# Patient Record
Sex: Male | Born: 1970
Health system: Southern US, Community
[De-identification: ages and names within clinical notes are randomized; demographics above are authoritative.]

## PROBLEM LIST (undated history)

## (undated) DIAGNOSIS — L4052 Psoriatic arthritis mutilans: Secondary | ICD-10-CM

## (undated) DIAGNOSIS — B20 Human immunodeficiency virus [HIV] disease: Secondary | ICD-10-CM

## (undated) DIAGNOSIS — Z21 Asymptomatic human immunodeficiency virus [HIV] infection status: Secondary | ICD-10-CM

## (undated) HISTORY — DX: Psoriatic arthritis mutilans: L40.52

## (undated) HISTORY — DX: Human immunodeficiency virus (HIV) disease: B20

## (undated) HISTORY — DX: Asymptomatic human immunodeficiency virus (hiv) infection status: Z21

---

## 2003-10-06 ENCOUNTER — Emergency Department (HOSPITAL_COMMUNITY): Admission: EM | Admit: 2003-10-06 | Discharge: 2003-10-06 | Payer: Self-pay | Admitting: Emergency Medicine

## 2003-10-06 ENCOUNTER — Encounter (INDEPENDENT_AMBULATORY_CARE_PROVIDER_SITE_OTHER): Payer: Self-pay | Admitting: Emergency Medicine

## 2003-10-21 ENCOUNTER — Inpatient Hospital Stay (HOSPITAL_COMMUNITY): Admission: AD | Admit: 2003-10-21 | Discharge: 2003-10-26 | Payer: Self-pay | Admitting: Infectious Diseases

## 2003-10-21 ENCOUNTER — Encounter: Admission: RE | Admit: 2003-10-21 | Discharge: 2003-10-21 | Payer: Self-pay | Admitting: Infectious Diseases

## 2003-11-02 ENCOUNTER — Inpatient Hospital Stay (HOSPITAL_COMMUNITY): Admission: AD | Admit: 2003-11-02 | Discharge: 2003-11-05 | Payer: Self-pay | Admitting: Internal Medicine

## 2003-11-04 ENCOUNTER — Encounter (INDEPENDENT_AMBULATORY_CARE_PROVIDER_SITE_OTHER): Payer: Self-pay | Admitting: Specialist

## 2003-11-04 ENCOUNTER — Encounter: Payer: Self-pay | Admitting: Infectious Diseases

## 2003-11-10 ENCOUNTER — Inpatient Hospital Stay (HOSPITAL_COMMUNITY): Admission: AD | Admit: 2003-11-10 | Discharge: 2003-11-18 | Payer: Self-pay | Admitting: Internal Medicine

## 2003-12-21 ENCOUNTER — Encounter (INDEPENDENT_AMBULATORY_CARE_PROVIDER_SITE_OTHER): Payer: Self-pay | Admitting: *Deleted

## 2003-12-21 LAB — CONVERTED CEMR LAB
CD4 Count: 49 microliters
CD4 T Cell Abs: 49

## 2004-02-04 ENCOUNTER — Encounter: Admission: RE | Admit: 2004-02-04 | Discharge: 2004-02-04 | Payer: Self-pay | Admitting: Infectious Diseases

## 2004-03-01 ENCOUNTER — Encounter: Admission: RE | Admit: 2004-03-01 | Discharge: 2004-03-01 | Payer: Self-pay | Admitting: Infectious Diseases

## 2004-03-14 ENCOUNTER — Encounter: Admission: RE | Admit: 2004-03-14 | Discharge: 2004-03-14 | Payer: Self-pay | Admitting: Infectious Diseases

## 2004-04-07 ENCOUNTER — Encounter: Admission: RE | Admit: 2004-04-07 | Discharge: 2004-04-07 | Payer: Self-pay | Admitting: Infectious Diseases

## 2004-06-15 ENCOUNTER — Encounter: Admission: RE | Admit: 2004-06-15 | Discharge: 2004-06-15 | Payer: Self-pay | Admitting: Infectious Diseases

## 2004-07-27 ENCOUNTER — Ambulatory Visit (HOSPITAL_COMMUNITY): Admission: RE | Admit: 2004-07-27 | Discharge: 2004-07-27 | Payer: Self-pay | Admitting: Infectious Diseases

## 2004-07-27 ENCOUNTER — Ambulatory Visit: Payer: Self-pay | Admitting: Infectious Diseases

## 2004-08-24 ENCOUNTER — Ambulatory Visit: Payer: Self-pay | Admitting: Infectious Diseases

## 2004-10-09 ENCOUNTER — Ambulatory Visit: Payer: Self-pay | Admitting: Infectious Diseases

## 2004-10-09 ENCOUNTER — Ambulatory Visit (HOSPITAL_COMMUNITY): Admission: RE | Admit: 2004-10-09 | Discharge: 2004-10-09 | Payer: Self-pay | Admitting: Infectious Diseases

## 2004-10-26 ENCOUNTER — Ambulatory Visit: Payer: Self-pay | Admitting: Infectious Diseases

## 2005-01-12 ENCOUNTER — Ambulatory Visit (HOSPITAL_COMMUNITY): Admission: RE | Admit: 2005-01-12 | Discharge: 2005-01-12 | Payer: Self-pay | Admitting: Infectious Diseases

## 2005-01-12 ENCOUNTER — Ambulatory Visit: Payer: Self-pay | Admitting: Infectious Diseases

## 2005-01-25 ENCOUNTER — Ambulatory Visit: Payer: Self-pay | Admitting: Infectious Diseases

## 2005-05-09 ENCOUNTER — Ambulatory Visit (HOSPITAL_COMMUNITY): Admission: RE | Admit: 2005-05-09 | Discharge: 2005-05-09 | Payer: Self-pay | Admitting: Infectious Diseases

## 2005-05-09 ENCOUNTER — Ambulatory Visit: Payer: Self-pay | Admitting: Infectious Diseases

## 2005-05-24 ENCOUNTER — Ambulatory Visit: Payer: Self-pay | Admitting: Infectious Diseases

## 2005-07-20 ENCOUNTER — Encounter (INDEPENDENT_AMBULATORY_CARE_PROVIDER_SITE_OTHER): Payer: Self-pay | Admitting: *Deleted

## 2005-07-20 ENCOUNTER — Ambulatory Visit: Payer: Self-pay | Admitting: Infectious Diseases

## 2005-07-20 ENCOUNTER — Ambulatory Visit (HOSPITAL_COMMUNITY): Admission: RE | Admit: 2005-07-20 | Discharge: 2005-07-20 | Payer: Self-pay | Admitting: Infectious Diseases

## 2005-07-20 LAB — CONVERTED CEMR LAB
CD4 Count: 480 uL
HIV 1 RNA Quant: 399 {copies}/mL

## 2006-02-28 ENCOUNTER — Encounter: Admission: RE | Admit: 2006-02-28 | Discharge: 2006-02-28 | Payer: Self-pay | Admitting: Infectious Diseases

## 2006-02-28 ENCOUNTER — Ambulatory Visit: Payer: Self-pay | Admitting: Infectious Diseases

## 2006-02-28 ENCOUNTER — Encounter (INDEPENDENT_AMBULATORY_CARE_PROVIDER_SITE_OTHER): Payer: Self-pay | Admitting: *Deleted

## 2006-02-28 LAB — CONVERTED CEMR LAB
CD4 Count: 390 microliters
HIV 1 RNA Quant: 399 copies/mL

## 2006-03-14 ENCOUNTER — Ambulatory Visit: Payer: Self-pay | Admitting: Infectious Diseases

## 2006-05-16 ENCOUNTER — Encounter: Admission: RE | Admit: 2006-05-16 | Discharge: 2006-05-16 | Payer: Self-pay | Admitting: Infectious Diseases

## 2006-05-16 ENCOUNTER — Ambulatory Visit: Payer: Self-pay | Admitting: Infectious Diseases

## 2006-05-16 ENCOUNTER — Encounter (INDEPENDENT_AMBULATORY_CARE_PROVIDER_SITE_OTHER): Payer: Self-pay | Admitting: *Deleted

## 2006-05-16 LAB — CONVERTED CEMR LAB
CD4 Count: 280 microliters
HIV 1 RNA Quant: 3000 copies/mL

## 2006-12-30 ENCOUNTER — Encounter (INDEPENDENT_AMBULATORY_CARE_PROVIDER_SITE_OTHER): Payer: Self-pay | Admitting: *Deleted

## 2006-12-30 LAB — CONVERTED CEMR LAB

## 2007-01-12 ENCOUNTER — Encounter (INDEPENDENT_AMBULATORY_CARE_PROVIDER_SITE_OTHER): Payer: Self-pay | Admitting: *Deleted

## 2007-10-15 ENCOUNTER — Telehealth: Payer: Self-pay

## 2008-02-11 ENCOUNTER — Inpatient Hospital Stay (HOSPITAL_COMMUNITY): Admission: EM | Admit: 2008-02-11 | Discharge: 2008-02-17 | Payer: Self-pay | Admitting: Emergency Medicine

## 2008-02-11 ENCOUNTER — Ambulatory Visit: Payer: Self-pay | Admitting: Infectious Diseases

## 2008-02-12 ENCOUNTER — Encounter (INDEPENDENT_AMBULATORY_CARE_PROVIDER_SITE_OTHER): Payer: Self-pay | Admitting: Internal Medicine

## 2008-02-16 ENCOUNTER — Telehealth: Payer: Self-pay | Admitting: Infectious Diseases

## 2008-02-17 ENCOUNTER — Encounter (INDEPENDENT_AMBULATORY_CARE_PROVIDER_SITE_OTHER): Payer: Self-pay | Admitting: Internal Medicine

## 2008-02-24 ENCOUNTER — Telehealth: Payer: Self-pay | Admitting: Infectious Diseases

## 2008-03-17 ENCOUNTER — Encounter: Payer: Self-pay | Admitting: Infectious Diseases

## 2008-03-18 ENCOUNTER — Ambulatory Visit: Payer: Self-pay | Admitting: Infectious Diseases

## 2008-03-18 ENCOUNTER — Encounter: Admission: RE | Admit: 2008-03-18 | Discharge: 2008-03-18 | Payer: Self-pay | Admitting: Infectious Diseases

## 2008-03-18 DIAGNOSIS — B20 Human immunodeficiency virus [HIV] disease: Secondary | ICD-10-CM | POA: Insufficient documentation

## 2008-03-18 LAB — CONVERTED CEMR LAB
ALT: 8 units/L (ref 0–53)
AST: 12 units/L (ref 0–37)
Albumin: 4.6 g/dL (ref 3.5–5.2)
Alkaline Phosphatase: 78 units/L (ref 39–117)
BUN: 19 mg/dL (ref 6–23)
Basophils Absolute: 0 10*3/uL (ref 0.0–0.1)
Basophils Relative: 0 % (ref 0–1)
CO2: 23 meq/L (ref 19–32)
Calcium: 9.6 mg/dL (ref 8.4–10.5)
Chloride: 103 meq/L (ref 96–112)
Creatinine, Ser: 0.98 mg/dL (ref 0.40–1.50)
Eosinophils Absolute: 0.1 10*3/uL (ref 0.0–0.7)
Eosinophils Relative: 1 % (ref 0–5)
Glucose, Bld: 91 mg/dL (ref 70–99)
HCT: 39.3 % (ref 39.0–52.0)
HIV 1 RNA Quant: 110 copies/mL — ABNORMAL HIGH (ref <50)
HIV-1 RNA Quant, Log: 2.04 — ABNORMAL HIGH (ref <1.70)
Hemoglobin: 13.2 g/dL (ref 13.0–17.0)
Lymphocytes Relative: 14 % (ref 12–46)
Lymphs Abs: 1.4 10*3/uL (ref 0.7–4.0)
MCHC: 33.6 g/dL (ref 30.0–36.0)
MCV: 90.8 fL (ref 78.0–100.0)
Monocytes Absolute: 1.2 10*3/uL — ABNORMAL HIGH (ref 0.1–1.0)
Monocytes Relative: 13 % — ABNORMAL HIGH (ref 3–12)
Neutro Abs: 6.9 10*3/uL (ref 1.7–7.7)
Neutrophils Relative %: 72 % (ref 43–77)
Platelets: 413 10*3/uL — ABNORMAL HIGH (ref 150–400)
Potassium: 4.2 meq/L (ref 3.5–5.3)
RBC: 4.33 M/uL (ref 4.22–5.81)
RDW: 13.2 % (ref 11.5–15.5)
Sodium: 141 meq/L (ref 135–145)
Total Bilirubin: 0.5 mg/dL (ref 0.3–1.2)
Total Protein: 8.4 g/dL — ABNORMAL HIGH (ref 6.0–8.3)
WBC: 9.6 10*3/uL (ref 4.0–10.5)

## 2008-04-05 ENCOUNTER — Encounter: Payer: Self-pay | Admitting: Infectious Diseases

## 2008-04-12 ENCOUNTER — Ambulatory Visit: Payer: Self-pay | Admitting: Infectious Diseases

## 2008-06-01 ENCOUNTER — Encounter: Admission: RE | Admit: 2008-06-01 | Discharge: 2008-06-01 | Payer: Self-pay | Admitting: Infectious Diseases

## 2008-06-01 ENCOUNTER — Ambulatory Visit: Payer: Self-pay | Admitting: Infectious Diseases

## 2008-06-01 LAB — CONVERTED CEMR LAB
ALT: 16 units/L (ref 0–53)
AST: 14 units/L (ref 0–37)
Albumin: 4.7 g/dL (ref 3.5–5.2)
Alkaline Phosphatase: 83 units/L (ref 39–117)
BUN: 21 mg/dL (ref 6–23)
Basophils Absolute: 0 10*3/uL (ref 0.0–0.1)
Basophils Relative: 0 % (ref 0–1)
CO2: 22 meq/L (ref 19–32)
Calcium: 9.9 mg/dL (ref 8.4–10.5)
Chloride: 102 meq/L (ref 96–112)
Creatinine, Ser: 1.1 mg/dL (ref 0.40–1.50)
Eosinophils Absolute: 0.1 10*3/uL (ref 0.0–0.7)
Eosinophils Relative: 1 % (ref 0–5)
Glucose, Bld: 100 mg/dL — ABNORMAL HIGH (ref 70–99)
HCT: 47 % (ref 39.0–52.0)
HIV 1 RNA Quant: 58 copies/mL — ABNORMAL HIGH (ref <50)
HIV-1 RNA Quant, Log: 1.76 — ABNORMAL HIGH (ref <1.70)
Hemoglobin: 16 g/dL (ref 13.0–17.0)
Lymphocytes Relative: 14 % (ref 12–46)
Lymphs Abs: 1.1 10*3/uL (ref 0.7–4.0)
MCHC: 34 g/dL (ref 30.0–36.0)
MCV: 88 fL (ref 78.0–100.0)
Monocytes Absolute: 1 10*3/uL (ref 0.1–1.0)
Monocytes Relative: 13 % — ABNORMAL HIGH (ref 3–12)
Neutro Abs: 5.5 10*3/uL (ref 1.7–7.7)
Neutrophils Relative %: 72 % (ref 43–77)
Platelets: 317 10*3/uL (ref 150–400)
Potassium: 4.4 meq/L (ref 3.5–5.3)
RBC: 5.34 M/uL (ref 4.22–5.81)
RDW: 15.5 % (ref 11.5–15.5)
Sodium: 138 meq/L (ref 135–145)
Total Bilirubin: 0.4 mg/dL (ref 0.3–1.2)
Total Protein: 8.1 g/dL (ref 6.0–8.3)
WBC: 7.7 10*3/uL (ref 4.0–10.5)

## 2008-06-11 ENCOUNTER — Ambulatory Visit: Payer: Self-pay | Admitting: Infectious Diseases

## 2008-06-11 ENCOUNTER — Telehealth: Payer: Self-pay

## 2008-06-15 ENCOUNTER — Telehealth: Payer: Self-pay

## 2008-06-16 ENCOUNTER — Encounter: Payer: Self-pay | Admitting: Infectious Diseases

## 2008-06-16 ENCOUNTER — Telehealth: Payer: Self-pay | Admitting: Infectious Diseases

## 2008-09-06 ENCOUNTER — Encounter: Payer: Self-pay | Admitting: Infectious Diseases

## 2010-09-25 ENCOUNTER — Encounter (INDEPENDENT_AMBULATORY_CARE_PROVIDER_SITE_OTHER): Payer: Self-pay | Admitting: *Deleted

## 2010-12-07 NOTE — Miscellaneous (Signed)
  Clinical Lists Changes  Observations: Added new observation of YEARAIDSPOS: 2006  (09/25/2010 11:07) Added new observation of HIV STATUS: CDC-defined AIDS  (09/25/2010 11:07)

## 2010-12-21 ENCOUNTER — Other Ambulatory Visit: Payer: Self-pay | Admitting: Infectious Diseases

## 2010-12-21 ENCOUNTER — Encounter: Payer: Self-pay | Admitting: Infectious Diseases

## 2010-12-21 ENCOUNTER — Ambulatory Visit (INDEPENDENT_AMBULATORY_CARE_PROVIDER_SITE_OTHER): Payer: Self-pay | Admitting: Infectious Diseases

## 2010-12-21 DIAGNOSIS — B20 Human immunodeficiency virus [HIV] disease: Secondary | ICD-10-CM

## 2010-12-21 LAB — CONVERTED CEMR LAB
ALT: 22 units/L (ref 0–53)
AST: 14 units/L (ref 0–37)
BUN: 21 mg/dL (ref 6–23)
Calcium: 9.8 mg/dL (ref 8.4–10.5)
Chloride: 101 meq/L (ref 96–112)
Creatinine, Ser: 1 mg/dL (ref 0.40–1.50)
Eosinophils Absolute: 0 10*3/uL (ref 0.0–0.7)
Eosinophils Relative: 0 % (ref 0–5)
HCT: 43.1 % (ref 39.0–52.0)
HIV 1 RNA Quant: 3040 copies/mL — ABNORMAL HIGH (ref <20)
Hemoglobin: 14.6 g/dL (ref 13.0–17.0)
Lymphocytes Relative: 14 % (ref 12–46)
Lymphs Abs: 2.4 10*3/uL (ref 0.7–4.0)
MCV: 91.1 fL (ref 78.0–100.0)
Monocytes Absolute: 2.2 10*3/uL — ABNORMAL HIGH (ref 0.1–1.0)
Platelets: 403 10*3/uL — ABNORMAL HIGH (ref 150–400)
Total Bilirubin: 0.3 mg/dL (ref 0.3–1.2)
WBC: 16.7 10*3/uL — ABNORMAL HIGH (ref 4.0–10.5)

## 2010-12-22 LAB — T-HELPER CELL (CD4) - (RCID CLINIC ONLY)
CD4 % Helper T Cell: 28 % — ABNORMAL LOW (ref 33–55)
CD4 T Cell Abs: 750 uL (ref 400–2700)

## 2011-01-02 NOTE — Miscellaneous (Signed)
Summary: Medication Contract  Medication Contract   Imported By: Florinda Marker 12/26/2010 15:06:38  _____________________________________________________________________  External Attachment:    Type:   Image     Comment:   External Document

## 2011-01-12 LAB — HIV-1 GENOTYPR PLUS

## 2011-01-18 ENCOUNTER — Telehealth: Payer: Self-pay | Admitting: *Deleted

## 2011-01-23 NOTE — Progress Notes (Signed)
  Phone Note Call from Patient   Caller: Patient Summary of Call: A PHARMACIST [facility] called [phone] asking if rx for Oxycontin 40mg  #60 was correct. unable to verify in chart as notes were not yet completed. I gave her the md pager number. she will speak with him   Initial call taken by: Golden Circle RN,  January 18, 2011 4:47 PM

## 2011-01-25 ENCOUNTER — Ambulatory Visit: Payer: Self-pay | Admitting: Infectious Diseases

## 2011-01-25 ENCOUNTER — Other Ambulatory Visit: Payer: Self-pay | Admitting: Licensed Clinical Social Worker

## 2011-01-25 ENCOUNTER — Ambulatory Visit (INDEPENDENT_AMBULATORY_CARE_PROVIDER_SITE_OTHER): Payer: Medicaid Other | Admitting: Infectious Diseases

## 2011-01-25 VITALS — BP 117/74 | HR 90 | Temp 98.4°F | Wt 145.4 lb

## 2011-01-25 DIAGNOSIS — B20 Human immunodeficiency virus [HIV] disease: Secondary | ICD-10-CM

## 2011-01-25 DIAGNOSIS — G8929 Other chronic pain: Secondary | ICD-10-CM

## 2011-01-25 DIAGNOSIS — R319 Hematuria, unspecified: Secondary | ICD-10-CM | POA: Insufficient documentation

## 2011-01-25 LAB — URINALYSIS, ROUTINE W REFLEX MICROSCOPIC
Leukocytes, UA: NEGATIVE
Nitrite: NEGATIVE
Specific Gravity, Urine: 1.024 (ref 1.005–1.030)
Urobilinogen, UA: 0.2 mg/dL (ref 0.0–1.0)
pH: 5.5 (ref 5.0–8.0)

## 2011-01-25 MED ORDER — RALTEGRAVIR POTASSIUM 400 MG PO TABS: 400.0000 mg | ORAL_TABLET | Freq: Two times a day (BID) | ORAL | Status: DC

## 2011-01-25 MED ORDER — OXYCODONE-ACETAMINOPHEN 5-325 MG PO TABS: 2.0000 | ORAL_TABLET | Freq: Two times a day (BID) | ORAL | Status: AC

## 2011-01-25 MED ORDER — FLUOCINONIDE 0.05 % EX OINT: 1.0000 "application " | TOPICAL_OINTMENT | Freq: Every day | CUTANEOUS | Status: DC

## 2011-01-25 MED ORDER — RITONAVIR 100 MG PO CAPS: 100.0000 mg | ORAL_CAPSULE | Freq: Two times a day (BID) | ORAL | Status: DC

## 2011-01-25 MED ORDER — DARUNAVIR ETHANOLATE 600 MG PO TABS: 600.0000 mg | ORAL_TABLET | Freq: Two times a day (BID) | ORAL | Status: DC

## 2011-01-25 MED ORDER — EMTRICITABINE-TENOFOVIR DF 200-300 MG PO TABS: 1.0000 | ORAL_TABLET | Freq: Every day | ORAL | Status: DC

## 2011-01-25 NOTE — Progress Notes (Signed)
  Subjective:    Patient ID: Britt Bottom, male    DOB: 1971/09/06, 40 y.o.   MRN: [id number]  HPIKeith has been ARVs for past two months but need new scripts. His HIV is 3040 which much improved from 6 weeks ago whe]n he had >40,000 VL. His CD4 count is 720 and he has had improvement in his psoriasis. He needs his opiates and will switch to Percocet 2 BID.      Review of Systems  Constitutional: Negative.   HENT: Negative.   Eyes: Negative.   Respiratory: Negative.   Cardiovascular: Negative.   Genitourinary: Positive for hematuria.  Musculoskeletal: Positive for back pain and arthralgias.       Objective:   Physical Exam  Constitutional: He appears well-developed and well-nourished.  HENT:  Mouth/Throat: Oropharynx is clear and moist.  Neck: Neck supple. No thyromegaly present.  Cardiovascular: Normal rate, regular rhythm and normal heart sounds.   Pulmonary/Chest: Breath sounds normal.  Abdominal: Soft. Bowel sounds are normal.  Genitourinary: Penis normal.  Lymphadenopathy:    He has no cervical adenopathy.  Skin: Skin is dry. Rash noted.       Psoriatic rash on umbilicus chronic and chronic nail bed changes of psroiasis.          Assessment & Plan:

## 2011-01-25 NOTE — Patient Instructions (Signed)
Fill new scripts and will see in f/u in 3 months.

## 2011-01-25 NOTE — Assessment & Plan Note (Signed)
Prior history x2 over past 3 years.No old records available. Noted mild dysuria and hematuria that clears during the day over past week.    Will get U/A and routine urine culture today.

## 2011-01-25 NOTE — Progress Notes (Signed)
Addended by: Starleen Arms on: 01/25/2011 11:43 AM   Modules accepted: Orders

## 2011-01-27 LAB — URINE CULTURE: Colony Count: 15000

## 2011-02-22 ENCOUNTER — Encounter: Payer: Self-pay | Admitting: Infectious Diseases

## 2011-02-22 ENCOUNTER — Other Ambulatory Visit: Payer: Self-pay | Admitting: Infectious Diseases

## 2011-02-22 ENCOUNTER — Other Ambulatory Visit: Payer: Self-pay | Admitting: Licensed Clinical Social Worker

## 2011-02-22 DIAGNOSIS — M1990 Unspecified osteoarthritis, unspecified site: Secondary | ICD-10-CM

## 2011-02-22 DIAGNOSIS — L4050 Arthropathic psoriasis, unspecified: Secondary | ICD-10-CM

## 2011-02-22 MED ORDER — OXYCODONE-ACETAMINOPHEN 5-325 MG PO TABS: 2.0000 | ORAL_TABLET | Freq: Two times a day (BID) | ORAL | Status: DC

## 2011-02-22 MED ORDER — OXYCODONE HCL 40 MG PO TB12: 40.0000 mg | ORAL_TABLET | Freq: Two times a day (BID) | ORAL | Status: DC

## 2011-03-22 ENCOUNTER — Other Ambulatory Visit: Payer: Self-pay | Admitting: *Deleted

## 2011-03-22 DIAGNOSIS — M1990 Unspecified osteoarthritis, unspecified site: Secondary | ICD-10-CM

## 2011-03-22 DIAGNOSIS — R52 Pain, unspecified: Secondary | ICD-10-CM

## 2011-03-22 DIAGNOSIS — L4050 Arthropathic psoriasis, unspecified: Secondary | ICD-10-CM

## 2011-03-22 MED ORDER — OXYCODONE-ACETAMINOPHEN 5-325 MG PO TABS: 2.0000 | ORAL_TABLET | Freq: Two times a day (BID) | ORAL | Status: DC

## 2011-03-22 NOTE — Telephone Encounter (Signed)
First script in Dr. Bonnetta Barry name, reprinted under Dr. Ninetta Lights, due to Dr. Maurice March on Fredna Dow CMA

## 2011-03-23 NOTE — Discharge Summary (Signed)
NAME:  Garrett Schroeder, Garrett Schroeder                          ACCOUNT NO.:  [id number]   MEDICAL RECORD NO.:  [id number]                   PATIENT TYPE:  INP   LOCATION:  5736                                 FACILITY:  [facility]   PHYSICIAN:  Fransisco Hertz, M.D.               DATE OF BIRTH:  [birth year]   DATE OF ADMISSION:  11/02/2003  DATE OF DISCHARGE:  11/05/2003                                 DISCHARGE SUMMARY   ADDENDUM:  To job #[id number].  Regarding anemia, the patient has refused blood  transfusion and wants to be discharged home today.  The associated risks  have been explained to the patient, although I do not feel that the patient  has an acute bleed.  I encouraged the patient to return to the hospital if  his condition worsens or if he develops any signs or symptoms of acute  bleeding episode.  The patient affirms understanding of this; however, he  desires to be discharged home with family today.   DISCHARGE MEDICATIONS:  Please add to discharge medications ferrous sulfate  325 mg 1 p.o. b.i.d.      Donald Pore, MD                          Fransisco Hertz, M.D.    HP/MEDQ  D:  11/05/2003  T:  11/05/2003  Job:  [id number]
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
## 2011-03-23 NOTE — Discharge Summary (Signed)
NAME:  KWALI, WRINKLE                          ACCOUNT NO.:  [id number]   MEDICAL RECORD NO.:  [id number]                   PATIENT TYPE:  INP   LOCATION:  5509                                 FACILITY:  [facility]   PHYSICIAN:  Cliffton Asters, M.D.                 DATE OF BIRTH:  1971/06/13   DATE OF ADMISSION:  11/10/2003  DATE OF DISCHARGE:  11/18/2003                                 DISCHARGE SUMMARY   DISCHARGE DIAGNOSES:  1. Human immunodeficiency virus.  2. Anemia.  3. Intermittent fever.  4. Failure to thrive.  5. Psoriasis.  6. Inguinal rash.  7. Bilateral knee pain/joint pain.  8. Generalized adenopathy.  9. Onychomycosis.   REASON FOR ADMISSION:  The patient is a 40 year old male recently diagnosed  in November 2004 with HIV and generalized lymphadenopathy.  Biopsy negative  to date for lymphoma with a last CD4 count November 03, 2003, of 170.  Admitted to [facility] with changes in ability to perform self care  and failure to thrive.  The patient seeks admission to [facility].   DISCHARGE MEDICATIONS:  1. Marinol 2.5 mg p.o. b.i.d. before lunch and dinner.  2. Sustiva 600 mg p.o. q.h.s.  3. Truvada 1 tablet p.o. daily.  4. Ensure 1 can p.o. t.i.d. with meals.  5. Ensure 1 can p.o. t.i.d. with snacks.  6. Lexapro 10 mg p.o. daily.  7. Diflucan 100 mg p.o. daily.  8. Hydrocortisone 2.5% cream 1 application b.i.d.  9. Ibuprofen 800 mg p.o. t.i.d.  10.      Megace 800 mg p.o. daily.\  11.      Morphine sulfate slow release 30 mg p.o. b.i.d.  12.      Therapeutic multivitamins 1 capsule daily.\  13.      Bactrim 1 tablet p.o. Monday, Wednesday, and Friday, 1 double-     strength tablet Monday, Wednesday, and Friday.  14.      Percocet 5/325 one tablet p.o. q.4h. breakthrough pain.  15.      Petrolatum 1 application topically p.r.n.   STUDIES:  1. Portable chest on November 10, 2003, showed no active disease.  2. Films of left tibia and fibula show no evidence  of fracture or     dislocation.  No other significant bone or soft tissue abnormalities     identified.  3. Films of right tibia and fibula show severe periarticular osteopenia of     the right knee and right ankle which has a patchy appearance.  This could     represent disuse osteopenia or reflex sympathetic, I think probably     dystrophy.  There is no discrete fracture or dislocation.  4. Film of the right elbow status post fall on November 11, 2003, showed no     acute abnormality of the right elbow.  5. Portable chest on November 11, 2003, showed a normal chest.  6. Laboratory values on November 12, 2003, significant for white blood cell     count of 12.9% with absolute neutrophil count of 10.1, hemoglobin 7.4,     hematocrit 22.9, platelet count 1012.  7. Pathology smear of the thrombocytosis showed overall signs consistent     with reactive thrombocytosis.  This included cryptococcal antigen     negative on November 12, 2003 and blood cultures which have no growth to     date.  Further laboratory evaluations were not performed, and we were     able to comfort care and supportive therapy for the patient.   HOSPITAL COURSE:  #1.  HUMAN IMMUNODEFICIENCY VIRUS:  The patient was continued on therapy of  Truvada and Sustiva and was compliant with medications. Repeat viral load  and CD4 count need to be drawn in the next four to six weeks to evaluate  response to therapy.   #2.  ANEMIA:  The patient has a history of anemia likely anemia of chronic  disease, and his total iron binding capacity was  within normal limits on  prior evaluation.  It is anticipated that as Mr. Saldarriaga's status improves as  well as decline in his viral load, he is likely to see some recovery in his  hemoglobin.  This can be evaluated with periodic monitoring.   #3.  INTERMITTENT FEVERS:  Mr. Paulino continued to spike intermittent fevers  throughout his hospital stay.  He has been afebrile for the last two days   prior to discharge.  These fevers are felt likely to be due to his virus or  possible lymphoma.  To date, he has been culture negative.  He has not shown  any clinical signs or symptoms of a bacterial infection.  Mr. Henk is not  being treated for any bacterial infections at this time.  His antibiotic  medications are totally for prophylaxis.  Fevers should be monitored.  The  patient should be clinically evaluated.  If it is felt that clinically he is  developing infection, he should be cultured, and appropriate therapy should  be initiated.  However, the patient appears clinically stable.  it is likely  that his fevers are related to his HIV and questionable lymphoma and should  be monitored without initiation of antibiotic therapy.  As stated above, Mr.  Huskins reported that he was eating one meal a day at home.  Weight on  admission was roughly 94 pounds.  On discharge, he weighed 47.2 kg which is  roughly 103 pounds.  His p.o. intake has improved greatly, and he has been  supplemented with six cans of Ensure daily plus additional Ensure per his  request p.r.n.   #4.  PSORIASIS:  Mr. Whitford has multiple psoriatic lesion primarily on his  lower extremities.  He has severe psoriasis of his right foot, though it has  improved greatly since his initial admission to the hospital in December  2004. This is being treated sporadically with hydrocortisone cream 2.5%  b.i.d.  Mr. Bernier is reluctant to allow staff to place the cream and has  only done so sporadically himself as he feels this is not working.  He has  been seen by a dermatologist, Dr. Donnie Coffin, for further consideration of  treatment of psoriasis.  Further consideration may need to be given to  alternative treatment of psoriasis.   #5.  INGUINAL RASH:  Mr. Baley has an inguinal rash which appears to be due  to tinea.  He is  on 100 mg Diflucan p.o. q.24h. for onychomycosis, and this therapy should be adequate for his inguinal  rashes well.   #6.  BILATERAL KNEE AND JOINT PAIN:  Radiographic films were taken including  MRI on prior admission and reviewed with cardiologist and medicine team.  Currently radiographically, Mr. Vandenberghe does not show signs of psoriatic  arthritis; however, psoriatic lesions.  Mr. Phillis only recently developed  psoriatic lesions, and there is question as to whether or not enough time  would have elapsed for him to show the destruc5tive changes of psoriatic  arthritis on plain films.  He does have bilateral knee and joint pain and is  being treated effectively with 800 mg p.o. t.i.d. ibuprofen for such pain.   #7.  GENERALIZED LYMPHADENOPATHY:  Mr. Marley does have generalized  lymphadenopathy, primarily calciform in his inguinal region.  On a prior CT  scan in December 2004, Mr. Sheahan did have lymph nodes in the regions of the  terminal ileum.  CT scan was questionable for a possible lymphoma of the  terminal ileum.  He was biopsy negative; however, it is not felt that  lymphoma can fully be eliminated from the differential at this time.   #8.  ONYCHOMYCOSIS:  Mr. Kenna does have onychomycosis of his nails,  primarily the nails on his hands.  He has been treated with Diflucan 100 mg  p.o. daily for this, and this therapy needs to be continued for three to six  months for effective recovery.      Donald Pore, MD                          Cliffton Asters, M.D.    HP/MEDQ  D:  11/17/2003  T:  11/17/2003  Job:  [id number]   cc:   Fransisco Hertz, M.D.  [address]  [place] [address]  Fax: [phone]   Dr. Garth Bigness, [facility]

## 2011-03-23 NOTE — Discharge Summary (Signed)
NAME:  Garrett Schroeder, Garrett Schroeder                          ACCOUNT NO.:  [id number]   MEDICAL RECORD NO.:  [id number]                   PATIENT TYPE:  INP   LOCATION:  5708                                 FACILITY:  [facility]   PHYSICIAN:  Blanch Media, M.D.             DATE OF BIRTH:  Nov 01, 1971   DATE OF ADMISSION:  10/21/2003  DATE OF DISCHARGE:  10/26/2003                                 DISCHARGE SUMMARY   DISCHARGE DIAGNOSES:  1. Fever of unknown origin with lesion suspicious for lymphoma in the     terminal ileum.  2. Human immunodeficiency virus.  3. 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  4. Normocytic anemia.  5. Bipolar disorder.   DISCHARGE MEDICATIONS:  1. Ibuprofen 600 mg four times a day.  2. Vicodin 5/500 one to two tablets q.4h. p.r.n. pain.  3. Diflucan 100 mg daily.  4. Keflex 500 mg four times a day.  5. Temovate ointment applied to the right foot b.i.d.  6. Hydrocortisone cream 2.5% apply to skin lesions under arm and groin     b.i.d.   FOLLOWUP:  The patient was instructed to return to the hospital on Tuesday  for further workup of his fevers, anemia, and so arrangements could be made  for him to get HAART medications.   CONSULTATIONS:  1. Adolph Pollack, M.D. of surgery was consulted for possible biopsy of     the lesion in the terminal ileum.  The patient had previously agreed to a     biopsy, however, when Dr. Abbey Chatters spoke with him he refused to have     surgery of any kind.  2. Hope M. Danella Deis, M.D. of dermatology was consulted regarding lesions of     the right foot.  Dr. Danella Deis diagnosed severe deforming psoriasis, and     recommended treatment with a course of Diflucan and antibiotics to cover     Staph and secondary yeast infections.  She also recommended soaking the     right foot in warm water for 10 to 20 minutes twice a day followed with     topical treatment with Temovate ointment.  Dr. Danella Deis also recommended  hydrocortisone 2.5% ointment b.i.d. to the axilla and groin regions.  3. Fransisco Hertz, M.D. of infectious disease was consulted regarding a new     diagnosis of HIV.  He recommended treatment with Truvada as well as     Sustiva.  4. Social work was consulted to assist in obtaining anti-viral medications.     This is to further be pursued during the next hospitalization.   HISTORY OF PRESENT ILLNESS:  The patient is a 40 year old male with recent  diagnosis of HIV who presented to the infectious disease clinic for followup  of his HIV status.  The patient at that time complaining of severe right  foot pain, intermittent fevers for four months, weight loss of about  10  pounds over six weeks, fatigue, diarrhea, and generalized myalgias.  The  patient denied any night sweats, cough, or nausea and vomiting.   PHYSICAL EXAMINATION:  VITAL SIGNS:  On physical examination, vital signs  are with a temperature of 101, pulse was 85, blood pressure 110/67,  respiratory rate of 14, O2 saturation of 98% on room air.  GENERAL:  The patient is an extremely thin white male in no acute distress.  HEENT:  Eyes were nonicteric.  Equal and reactive to light.  ENT was without  any thrush.  Oral mucosa appeared moist.  NECK:  Supple without any JVD or masses.  RESPIRATORY:  The patient had good air movement.  LUNGS:  Clear.  CARDIOVASCULAR:  Regular rate and rhythm.  ABDOMEN:  Soft, nontender, with positive bowel sounds in all four quadrants.  EXTREMITIES:  The patient had no edema in the lower extremities.  He did,  however, have hyperkeratotic lesions on his right foot on the inferior  aspect and between the toes.  Hyperkeratotic lesions were almost nodular in  appearance.  The patient also had erythematous scaly lesions in his groin  and axillary regions.  MUSCULOSKELETAL:  The patient has 5/5 strength bilaterally.  NEUROLOGIC:  The patient was alert and oriented x3.  Cranial nerves II-XII  were  grossly intact.  PSYCHIATRIC:  The patient had an extremely flat affect, answered questions  abruptly and appeared withdrawn.   LABORATORY DATA:  BMET was with a sodium of 138, potassium of 3.8, chloride  of 100, CO2 of 28, BUN of 12, creatinine of 0.7, glucose was 117.  LFTs were  within normal limits.  CBC with a white blood cell count of 10.1, hemoglobin  of 9.9, and platelet count of 633,000.   HOSPITAL COURSE:  #1 -  FEVERS:  The patient was admitted to the hospital,  two sets of blood cultures, an urinalysis, and chest x-ray were checked.  Cultures were all negative.  Chest x-ray was without any infiltrate.  UA  without any signs of infection.  The patient was started empirically on  Zosyn.  Hepatitis panel and toxoplasmosis antigen were also checked which  were negative.  Given history of recent HIV diagnosis as well as weight  loss, the patient had CT scans of the head, chest, abdomen, and pelvis done  for possible lymphoma.  These were performed on October 22, 2003.  Head CT  scan without contrast was normal.  Chest CT scan with contrast without any  definite abnormality other than questionably enlarged right axillary lymph  node.  CT scan of the abdomen with contrast was with multiple lymph nodes in  the peri-aortic and mesenteric areas, all of which were less than 1.5 cm in  diameter.  Spleen was also enlarged.  CT scan of the pelvis revealed  multiple enlarged lymph nodes in the inguinal areas bilaterally, and a  questionable abnormality associated with the region of the terminal ileum  along with associated lymph nodes anteriorly.  Per the radiologist, CT scans  were consistent with a lymphoma possibly involving the terminal ileum.  Dr.  Abbey Chatters of surgery again was consulted regarding a possible lymph node  biopsy, however, the patient refused to have this done during  hospitalization, and in fact, refused all treatment.  An agreement was made that he would return to  the hospital on the Tuesday following Christmas to  have biopsy performed.  The patient was given ibuprofen to take at home to  help  with the fevers.  #2 -  HUMAN IMMUNODEFICIENCY VIRUS:  The patient with a diagnosis of HIV  while he was in [place].  The patient is being followed by the infectious  disease clinic.  Dr. Maurice March was consulted to examine the patient.  She  recommended treatment with Sustiva and Truvada.  These were started after  discharge.  Social work was consulted to help with obtaining these  medications.  #3 -  SEVERE DEFORMING PSORIASIS:  Dr. Danella Deis of dermatology was consulted  regarding these foot lesions.  She recommended the treatment as outlined  above.  This was started.  The patient was treated with Diflucan, Keflex,  Tinovate cream, hydrocortisone cream, and foot soaks b.i.d.  MRI of the  lower extremities was also obtained to rule out osteomyelitis, this was done  on October 22, 2003, and revealed only non-specific edema of the muscle  with no signs of osteomyelitis.  #4 -  NORMOCYTIC ANEMIA:  Ferritin, RBC, folate, B12 levels were all checked  and were within normal limits.  Hemoccult was done x2.  These were all  without identifiable etiology for anemia.  The patient refused transfusion  and further workup of his anemia.  He was discharged to home understanding  that he would return on the following Tuesday for further workup of his  anemia.  #5 -  TOBACCO USE:  A smoking cessation consult was placed.  The patient was  counseled regarding tobacco use, and was given nicotine patch while in the  hospital.  #6 -  BIPOLAR DISORDER:  The patient had previously been treated with  Lithium, however, he is not currently on any medication.  He refused  psychiatric consultation and psychiatric medications.   DISPOSITION:  The patient was discharged to home in the care of his mother  and is to return to the hospital on the following Tuesday.  Arrangements  have been  made with __________and the patient is be followed at home when a  bed is available.  During the hospitalization, the patient is to have a  biopsy of inguinal lymph nodes for possible lymphoma.   LABORATORY DATA:  Labs at discharge:  The patient refused labs during the  last three days of his hospitalization.  On October 24, 2003, the CBC was  with a white blood cell count of 8.6, hemoglobin 7.6, and platelet count was  580,000.  A BMET done on October 24, 2003, was with a sodium of 131,  potassium 3.2, chloride 98, CO2 25, BUN 6, creatinine 0.8, and glucose of  99.  LFTs again were all within normal limits.      Manning Charity, MD                        Blanch Media, M.D.    KK/MEDQ  D:  12/04/2003  T:  12/05/2003  Job:  [id number]

## 2011-03-23 NOTE — Op Note (Signed)
NAME:  Garrett Schroeder, Garrett Schroeder                          ACCOUNT NO.:  [id number]   MEDICAL RECORD NO.:  [id number]                   PATIENT TYPE:  INP   LOCATION:  5736                                 FACILITY:  [facility]   PHYSICIAN:  Thornton Park. Daphine Deutscher, M.D.             DATE OF BIRTH:  29-Jun-1971   DATE OF PROCEDURE:  11/04/2003  DATE OF DISCHARGE:                                 OPERATIVE REPORT   PREOPERATIVE DIAGNOSES:  1. Human immunodeficiency virus.  2. Adenopathy.  3. Rule out lymphoma.   POSTOPERATIVE DIAGNOSES:  1. Human immunodeficiency virus.  2. Adenopathy.  3. Rule out lymphoma.   PROCEDURE:  Right inguinal lymph node biopsy.   SURGEON:  Thornton Park. Daphine Deutscher, M.D.   ANESTHESIA:  General by LMA.   INDICATIONS:  Garrett Schroeder is a 40 year old male who in addition to being  HIV positive has recently developed psoriasis and now with adenopathy.  A  lymph node biopsy was requested, and this was discussed with him including  complications limited to lymphocele infection, bleeding.  We were able to  get this on the OR schedule on November 04, 2003.   DESCRIPTION OF PROCEDURE:  The patient was taken to room 16 and given  general by LMA.  The small incision was made overlying the easily palpable  nodes, and these were dissected free.  I ended up having to separate  clusters of nodes to get two nodes together and then ligated this from the  surrounding structures with 2-0 Vicryl ties.  At the end, there was no  bleeding in the wound.  The wound was closed with 4-0 Vicryl subcutaneously  and with staples.  It was injected with 0.5% Marcaine.  The patient seemed  to tolerate the procedure well and was taken to the recovery room in  satisfactory condition.                                               Thornton Park Daphine Deutscher, M.D.    MBM/MEDQ  D:  11/04/2003  T:  11/04/2003  Job:  [id number]   cc:   Fransisco Hertz, M.D.  [address]  [place] [address]  Fax: [phone]

## 2011-03-23 NOTE — Discharge Summary (Signed)
NAME:  Garrett Schroeder, Garrett Schroeder                          ACCOUNT NO.:  [id number]   MEDICAL RECORD NO.:  [id number]                   PATIENT TYPE:  INP   LOCATION:  5736                                 FACILITY:  [facility]   PHYSICIAN:  Fransisco Hertz, M.D.               DATE OF BIRTH:  Jan 20, 1971   DATE OF ADMISSION:  11/02/2003  DATE OF DISCHARGE:                                 DISCHARGE SUMMARY   ATTENDING PHYSICIANS:  1. Dr. Blanch Media.  2. Dr. Fransisco Hertz.   DISCHARGE DIAGNOSES:  1. Lymphadenopathy.  2. Febrile illness.  3. 042.  4. Deforming psoriasis.  5. Anemia.  6. Bipolar disorder.   REASON FOR ADMISSION:  Fever, inguinal lymphadenopathy.   HISTORY OF PRESENT ILLNESS:  Briefly, this is a 40 year old male recently  diagnosed with 042, previously hospitalized at October 21, 2003 with four  months of intermittent fevers, weight loss and severe right foot pain; also  recently diagnosed with deforming psoriasis.  Patient has returned to the  hospital for a lymph node biopsy for further workup of a questionable  lymphoma.   HOSPITAL COURSE:  PROBLEM #1 - LYMPHADENOPATHY:  On November 04, 2003, a  right inguinal lymph node biopsy was performed by Dr. Molli Hazard B. Daphine Deutscher.  The patient tolerated the procedure well and pathology reports are pending.  The patient should have surgical staples removed on November 10, 2002.   PROBLEM #2 - FEVERS:  The patient's temperatures ranged from 97.8 to 100.4  during this admission.  Intermittent fevers could be secondary to his 042  status and possibly due to his questionable lymphoma or other process.  Blood cultures remain negative to date.  No other known source of infection  was established during this stay.   PROBLEM #3 - O42:  The patient was started on Sustiva and Truvada on  November 04, 2003, Truvada 200/300 mg one p.o. q.24 h. and Sustiva 600 mg  one p.o. q.24 h. at bedtime.  On November 04, 2003, importance of compliance  with medication was explained to the patient and the patient was supplied  with a 30-day supply of his HIV medication through outpatient clinic.  A  viral load was obtained and results are pending.  CD4 count was obtained on  November 03, 2003 and it was 170, so the patient was also started on Bactrim  double-strength prophylaxis upon discharge.  The patient was started on  Megace 800 mg daily to help prevent wasting.  The patient was also given  information regarding [facility] and is to follow up with them  for help with his disability approval process.  The patient is to follow up  in the infectious disease clinic with Dr. Fransisco Hertz for results of his  lymph node biopsy as well as for further treatment of his HIV.   PROBLEM #4 - DEFORMING PSORIASIS:  The patient was discharged  on Keflex 500  mg one p.o. four times daily for bacterial infectious component, which may  be superimposed on.  He is to continue his 0.5% hydrocortisone cream  applications.  He is discharged on Diflucan 100 mg p.o. daily for  superimposed fungal component and also is to use petrolatum gel 30% as  needed on skin lesions.  For pain related to this psoriasis, the patient was  discharged on morphine sulfate 15 mg one p.o. b.i.d., number dispensed -- 30  with no refills.  He was also given a prescription for Percocet for  breakthrough pain, that is, Percocet 5/325 mg one tablet p.o. q.4 h. p.r.n.  pain, number dispensed -- 42, no refills.   PROBLEM #5 - ANEMIA:  The patient has been anemic since his initial  presentation to [facility] and on prior admissions, had a  hemoglobin initially of 8.2.  On prior discharge, hemoglobin was 7.6.  On  this admission, the patient was admitted with a hemoglobin of 7.7, which  remained stable.  On November 05, 2003, the patient's hemoglobin was  measured at 7.1; this could be attributed to laboratory variability as well  as to dilution, however, as the  patient was pending discharge, it was  thought best to transfuse the patient with two units of packed red cells.  As hemoglobin has been chronically stable, it is not felt that the patient  is suffering from an acute bleed.  However, the patient will be closely  monitored and close followup will be scheduled in the outpatient clinic  after discharge.   PROBLEM #6 - BIPOLAR DISORDER:  The patient is not on any medication at this  time.  Previously, the patient has refused psychiatric consultation and  medications, however, should his psychiatric condition  worsen, treatment  therapy will then be considered.   DISCHARGE MEDICATIONS:  1. Bactrim double strength one tablet by mouth every     Monday/Wednesday/Friday.  2. Megace 800 mg daily.  3. Protonix 40 mg one p.o. daily.  4. Keflex 500 mg one p.o. four times daily.  5. Sustiva 600 mg one p.o. daily nightly.  6. Truvada 200/300 mg one p.o. daily.  7. Diflucan 100 mg one p.o. daily.  8. Hydrocortisone cream 2.5% -- apply to groin and under arm area twice     daily.  9. Petrolatum gel -- apply to skin lesions p.r.n.  10.      Morphine sulfate 50 mg one p.o. b.i.d.  11.      Percocet one tablet q.4 h. p.r.n. pain.   WOUND CARE:  The patient is scheduled for staple removal on November 10, 2002.   FOLLOWUP:  He is to call the infectious disease clinic to schedule a  followup appointment next week with Dr. Lina Sayre.  Dr. Maurice March will also  get in touch with him regarding his lymph node biopsy results.      Donald Pore, MD                          Fransisco Hertz, M.D.    HP/MEDQ  D:  11/05/2003  T:  11/05/2003  Job:  [id number]
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
## 2011-04-19 ENCOUNTER — Other Ambulatory Visit: Payer: Self-pay | Admitting: *Deleted

## 2011-04-19 ENCOUNTER — Other Ambulatory Visit: Payer: Medicaid Other

## 2011-04-19 DIAGNOSIS — M1990 Unspecified osteoarthritis, unspecified site: Secondary | ICD-10-CM

## 2011-04-19 DIAGNOSIS — R52 Pain, unspecified: Secondary | ICD-10-CM

## 2011-04-19 MED ORDER — OXYCODONE-ACETAMINOPHEN 5-325 MG PO TABS: 2.0000 | ORAL_TABLET | Freq: Two times a day (BID) | ORAL | Status: DC

## 2011-04-26 ENCOUNTER — Ambulatory Visit: Payer: Medicaid Other | Admitting: Infectious Diseases

## 2011-05-03 ENCOUNTER — Ambulatory Visit: Payer: Medicaid Other | Admitting: Infectious Diseases

## 2011-05-23 ENCOUNTER — Other Ambulatory Visit: Payer: Self-pay | Admitting: Licensed Clinical Social Worker

## 2011-05-23 ENCOUNTER — Other Ambulatory Visit: Payer: Medicaid Other

## 2011-05-23 DIAGNOSIS — B20 Human immunodeficiency virus [HIV] disease: Secondary | ICD-10-CM

## 2011-05-23 DIAGNOSIS — M1990 Unspecified osteoarthritis, unspecified site: Secondary | ICD-10-CM

## 2011-05-23 MED ORDER — OXYCODONE-ACETAMINOPHEN 5-325 MG PO TABS: 2.0000 | ORAL_TABLET | Freq: Two times a day (BID) | ORAL | Status: DC

## 2011-05-24 LAB — COMPREHENSIVE METABOLIC PANEL
BUN: 21 mg/dL (ref 6–23)
CO2: 26 mEq/L (ref 19–32)
Calcium: 9.7 mg/dL (ref 8.4–10.5)
Chloride: 103 mEq/L (ref 96–112)
Creat: 1.16 mg/dL (ref 0.50–1.35)
Glucose, Bld: 89 mg/dL (ref 70–99)

## 2011-05-24 LAB — CBC WITH DIFFERENTIAL/PLATELET
Basophils Absolute: 0 10*3/uL (ref 0.0–0.1)
Eosinophils Relative: 1 % (ref 0–5)
HCT: 47.3 % (ref 39.0–52.0)
Lymphocytes Relative: 19 % (ref 12–46)
Lymphs Abs: 1.8 10*3/uL (ref 0.7–4.0)
MCV: 94.4 fL (ref 78.0–100.0)
Monocytes Absolute: 0.9 10*3/uL (ref 0.1–1.0)
Monocytes Relative: 10 % (ref 3–12)
RDW: 13.2 % (ref 11.5–15.5)
WBC: 9.5 10*3/uL (ref 4.0–10.5)

## 2011-05-24 LAB — T-HELPER CELL (CD4) - (RCID CLINIC ONLY)
CD4 % Helper T Cell: 28 % — ABNORMAL LOW (ref 33–55)
CD4 T Cell Abs: 550 uL (ref 400–2700)

## 2011-05-24 LAB — HIV-1 RNA QUANT-NO REFLEX-BLD
HIV 1 RNA Quant: 417 copies/mL — ABNORMAL HIGH (ref <20)
HIV-1 RNA Quant, Log: 2.62 {Log} — ABNORMAL HIGH (ref <1.30)

## 2011-06-25 ENCOUNTER — Other Ambulatory Visit: Payer: Self-pay | Admitting: *Deleted

## 2011-06-25 DIAGNOSIS — R52 Pain, unspecified: Secondary | ICD-10-CM

## 2011-06-25 DIAGNOSIS — M1990 Unspecified osteoarthritis, unspecified site: Secondary | ICD-10-CM

## 2011-06-25 MED ORDER — OXYCODONE-ACETAMINOPHEN 5-325 MG PO TABS: 2.0000 | ORAL_TABLET | Freq: Two times a day (BID) | ORAL | Status: DC

## 2011-07-31 LAB — WOUND CULTURE: Gram Stain: NONE SEEN

## 2011-07-31 LAB — CBC
HCT: 32.8 — ABNORMAL LOW
HCT: 34.7 — ABNORMAL LOW
HCT: 35.4 — ABNORMAL LOW
HCT: 36.3 — ABNORMAL LOW
HCT: 39.5
HCT: 41.8
Hemoglobin: 11.6 — ABNORMAL LOW
Hemoglobin: 12.2 — ABNORMAL LOW
Hemoglobin: 12.3 — ABNORMAL LOW
Hemoglobin: 12.6 — ABNORMAL LOW
Hemoglobin: 13.5
Hemoglobin: 14.4
MCHC: 34.3
MCHC: 34.5
MCHC: 34.6
MCHC: 34.6
MCHC: 35.1
MCHC: 35.5
MCV: 93
MCV: 93.8
MCV: 94
MCV: 94.2
MCV: 94.5
MCV: 94.6
Platelets: 295
Platelets: 296
Platelets: 296
Platelets: 309
Platelets: 345
Platelets: 346
RBC: 3.48 — ABNORMAL LOW
RBC: 3.73 — ABNORMAL LOW
RBC: 3.76 — ABNORMAL LOW
RBC: 3.87 — ABNORMAL LOW
RBC: 4.18 — ABNORMAL LOW
RBC: 4.45
RDW: 13.2
RDW: 13.3
RDW: 13.4
RDW: 13.4
RDW: 13.5
RDW: 13.7
WBC: 5.7
WBC: 6.9
WBC: 7.9
WBC: 8.2
WBC: 8.8
WBC: 8.9

## 2011-07-31 LAB — LIPID PANEL
Cholesterol: 129
HDL: 24 — ABNORMAL LOW
LDL Cholesterol: 94
Total CHOL/HDL Ratio: 5.4
Triglycerides: 57
VLDL: 11

## 2011-07-31 LAB — CULTURE, BLOOD (ROUTINE X 2): Culture: NO GROWTH

## 2011-07-31 LAB — AFB CULTURE WITH SMEAR (NOT AT ARMC): Acid Fast Smear: NONE SEEN

## 2011-07-31 LAB — BASIC METABOLIC PANEL
BUN: 11
BUN: 19
BUN: 4 — ABNORMAL LOW
BUN: 5 — ABNORMAL LOW
BUN: 6
BUN: 7
CO2: 24
CO2: 25
CO2: 27
CO2: 27
CO2: 28
CO2: 30
Calcium: 8.6
Calcium: 9.1
Calcium: 9.1
Calcium: 9.2
Calcium: 9.3
Calcium: 9.8
Chloride: 101
Chloride: 102
Chloride: 102
Chloride: 97
Chloride: 99
Chloride: 99
Creatinine, Ser: 0.92
Creatinine, Ser: 0.94
Creatinine, Ser: 0.95
Creatinine, Ser: 0.99
Creatinine, Ser: 1.11
Creatinine, Ser: 1.18
GFR calc Af Amer: >60
GFR calc Af Amer: >60
GFR calc Af Amer: >60
GFR calc Af Amer: >60
GFR calc Af Amer: >60
GFR calc Af Amer: >60
GFR calc non Af Amer: >60
GFR calc non Af Amer: >60
GFR calc non Af Amer: >60
GFR calc non Af Amer: >60
GFR calc non Af Amer: >60
GFR calc non Af Amer: >60
Glucose, Bld: 114 — ABNORMAL HIGH
Glucose, Bld: 119 — ABNORMAL HIGH
Glucose, Bld: 133 — ABNORMAL HIGH
Glucose, Bld: 83
Glucose, Bld: 84
Glucose, Bld: 99
Potassium: 3.5
Potassium: 3.6
Potassium: 3.8
Potassium: 4
Potassium: 4.1
Potassium: 4.1
Sodium: 132 — ABNORMAL LOW
Sodium: 134 — ABNORMAL LOW
Sodium: 136
Sodium: 137
Sodium: 137
Sodium: 138

## 2011-07-31 LAB — HEPATIC FUNCTION PANEL
ALT: 11
ALT: 14
AST: 17
AST: 18
Albumin: 3.3 — ABNORMAL LOW
Albumin: 3.5
Alkaline Phosphatase: 63
Alkaline Phosphatase: 69
Bilirubin, Direct: 0.1
Bilirubin, Direct: <0.1
Indirect Bilirubin: 0.1 — ABNORMAL LOW
Total Bilirubin: 0.2 — ABNORMAL LOW
Total Bilirubin: 0.3
Total Protein: 6.9
Total Protein: 7.5

## 2011-07-31 LAB — FUNGUS CULTURE, BLOOD
Culture: NO GROWTH
Culture: NO GROWTH

## 2011-07-31 LAB — AFB CULTURE, BLOOD

## 2011-07-31 LAB — RAPID URINE DRUG SCREEN, HOSP PERFORMED
Amphetamines: NOT DETECTED
Barbiturates: NOT DETECTED
Benzodiazepines: NOT DETECTED
Cocaine: NOT DETECTED
Opiates: POSITIVE — AB
Tetrahydrocannabinol: POSITIVE — AB

## 2011-07-31 LAB — URINE CULTURE
Colony Count: 1000
Colony Count: >=100000
Special Requests: NEGATIVE

## 2011-07-31 LAB — ETHANOL
Alcohol, Ethyl (B): <5
Alcohol, Ethyl (B): <5

## 2011-07-31 LAB — FOLATE RBC: RBC Folate: 624 — ABNORMAL HIGH

## 2011-07-31 LAB — TISSUE CULTURE: Gram Stain: NONE SEEN

## 2011-07-31 LAB — T-HELPER CELLS (CD4) COUNT (NOT AT ARMC)
CD4 % Helper T Cell: 24 — ABNORMAL LOW
CD4 T Cell Abs: 200 — ABNORMAL LOW

## 2011-07-31 LAB — URINALYSIS, ROUTINE W REFLEX MICROSCOPIC
Bilirubin Urine: NEGATIVE
Glucose, UA: NEGATIVE
Hgb urine dipstick: NEGATIVE
Ketones, ur: NEGATIVE
Nitrite: NEGATIVE
Protein, ur: NEGATIVE
Specific Gravity, Urine: 1.023
Urobilinogen, UA: 1
pH: 6

## 2011-07-31 LAB — CULTURE, RESPIRATORY

## 2011-07-31 LAB — DIFFERENTIAL
Basophils Absolute: 0
Basophils Relative: 0
Eosinophils Absolute: 0.1
Eosinophils Relative: 1
Lymphocytes Relative: 9 — ABNORMAL LOW
Lymphs Abs: 0.7
Monocytes Absolute: 0.9
Monocytes Relative: 12
Neutro Abs: 6.4
Neutrophils Relative %: 78 — ABNORMAL HIGH

## 2011-07-31 LAB — RPR: RPR Ser Ql: NONREACTIVE

## 2011-07-31 LAB — HIV ANTIBODY (ROUTINE TESTING W REFLEX): HIV: REACTIVE — AB

## 2011-07-31 LAB — HERPES SIMPLEX VIRUS CULTURE
Culture: NOT DETECTED
Culture: NOT DETECTED

## 2011-07-31 LAB — HIV 1/2 CONFIRMATION
HIV-1 antibody: POSITIVE
HIV-2 Ab: NEGATIVE

## 2011-07-31 LAB — HEPATITIS PANEL, ACUTE
HCV Ab: NEGATIVE
Hep A IgM: NEGATIVE
Hep B C IgM: NEGATIVE
Hepatitis B Surface Ag: NEGATIVE

## 2011-07-31 LAB — ZINC: Zinc: 657 ug/L (ref 600–1200)

## 2011-07-31 LAB — TISSUE TRANSGLUTAMINASE, IGA: Tissue Transglutaminase Ab, IgA: 0.6 U/mL (ref <7)

## 2011-07-31 LAB — GC/CHLAMYDIA PROBE AMP, URINE
Chlamydia, Swab/Urine, PCR: NEGATIVE
Chlamydia, Swab/Urine, PCR: NEGATIVE
GC Probe Amp, Urine: NEGATIVE
GC Probe Amp, Urine: NEGATIVE

## 2011-07-31 LAB — URINE MICROSCOPIC-ADD ON

## 2011-07-31 LAB — MAGNESIUM
Magnesium: 1.8
Magnesium: 2.4

## 2011-07-31 LAB — CRYPTOCOCCAL ANTIGEN: Crypto Ag: NEGATIVE

## 2011-07-31 LAB — EXPECTORATED SPUTUM ASSESSMENT W GRAM STAIN, RFLX TO RESP C

## 2011-07-31 LAB — MISCELLANEOUS TEST
Miscellaneous Test Results: 53
Miscellaneous Test Results: NEGATIVE

## 2011-07-31 LAB — FUNGUS CULTURE W SMEAR: Fungal Smear: NONE SEEN

## 2011-07-31 LAB — HEMOGLOBIN A1C
Hgb A1c MFr Bld: 5.6
Mean Plasma Glucose: 122

## 2011-07-31 LAB — HIV-1 GENOTYPR PLUS

## 2011-07-31 LAB — VITAMIN B12: Vitamin B-12: 235 (ref 211–911)

## 2011-07-31 LAB — CULTURE, RESPIRATORY W GRAM STAIN: Culture: NORMAL

## 2011-08-03 LAB — T-HELPER CELL (CD4) - (RCID CLINIC ONLY)
CD4 % Helper T Cell: 32 — ABNORMAL LOW
CD4 T Cell Abs: 370 — ABNORMAL LOW

## 2011-09-03 ENCOUNTER — Telehealth: Payer: Self-pay | Admitting: *Deleted

## 2011-09-03 NOTE — Telephone Encounter (Signed)
Mr. Garrett Schroeder [facility] to get last cd4 & vl. Gave him the numbers

## 2011-10-24 ENCOUNTER — Other Ambulatory Visit: Payer: Self-pay | Admitting: *Deleted

## 2011-10-24 DIAGNOSIS — M1990 Unspecified osteoarthritis, unspecified site: Secondary | ICD-10-CM

## 2011-10-24 DIAGNOSIS — R52 Pain, unspecified: Secondary | ICD-10-CM

## 2011-10-24 MED ORDER — OXYCODONE-ACETAMINOPHEN 5-325 MG PO TABS: 2.0000 | ORAL_TABLET | Freq: Two times a day (BID) | ORAL | Status: DC

## 2011-11-15 ENCOUNTER — Ambulatory Visit (INDEPENDENT_AMBULATORY_CARE_PROVIDER_SITE_OTHER): Payer: Medicaid Other | Admitting: Infectious Diseases

## 2011-11-15 ENCOUNTER — Encounter: Payer: Self-pay | Admitting: Infectious Diseases

## 2011-11-15 VITALS — BP 112/75 | HR 71 | Temp 98.5°F | Ht 66.0 in | Wt 147.5 lb

## 2011-11-15 DIAGNOSIS — Z79899 Other long term (current) drug therapy: Secondary | ICD-10-CM

## 2011-11-15 DIAGNOSIS — F17200 Nicotine dependence, unspecified, uncomplicated: Secondary | ICD-10-CM | POA: Insufficient documentation

## 2011-11-15 DIAGNOSIS — R52 Pain, unspecified: Secondary | ICD-10-CM

## 2011-11-15 DIAGNOSIS — Z23 Encounter for immunization: Secondary | ICD-10-CM

## 2011-11-15 DIAGNOSIS — M1990 Unspecified osteoarthritis, unspecified site: Secondary | ICD-10-CM

## 2011-11-15 DIAGNOSIS — B20 Human immunodeficiency virus [HIV] disease: Secondary | ICD-10-CM

## 2011-11-15 DIAGNOSIS — Z113 Encounter for screening for infections with a predominantly sexual mode of transmission: Secondary | ICD-10-CM

## 2011-11-15 MED ORDER — VARENICLINE TARTRATE 0.5 MG PO TABS: 0.5000 mg | ORAL_TABLET | Freq: Two times a day (BID) | ORAL | Status: AC

## 2011-11-15 MED ORDER — PREDNISONE 10 MG PO TABS: 10.0000 mg | ORAL_TABLET | Freq: Every day | ORAL | Status: DC

## 2011-11-15 MED ORDER — OXYCODONE-ACETAMINOPHEN 5-325 MG PO TABS: 2.0000 | ORAL_TABLET | Freq: Two times a day (BID) | ORAL | Status: DC

## 2011-11-15 MED ORDER — PREDNISONE 20 MG PO TABS: 20.0000 mg | ORAL_TABLET | Freq: Every day | ORAL | Status: AC

## 2011-11-15 MED ORDER — VARENICLINE TARTRATE 1 MG PO TABS: 1.0000 mg | ORAL_TABLET | Freq: Two times a day (BID) | ORAL | Status: AC

## 2011-11-15 NOTE — Progress Notes (Signed)
Subjective:    Patient ID: Garrett Schroeder is a 42 y.o. male.  Chief Complaint:Garan has not been in regular care for over 9 months and suspect that he intermittently takes his ARVs. He is on Medicaid and has access to medications. The past two months his psoriasis has reactivated and suspect his HIV is active. Will restart ARVs, and repeat baseline labs.   Data Review:   ROSDiffuse erythematous psortiatic rash.   Objective:  Physical ExamBP 112/75  Pulse 71  Temp(Src) 98.5 F (36.9 C) (Oral)  Ht 5\' 6"  (1.676 m)  Wt 147 lb 8 oz (66.906 kg)  BMI 23.81 kg/m2 Diffuse psoriasis. No new joint involvement. Laboratory:   Assessment:  HIV likely active Psoriatic rash  Plan:  Restart HIV meds Prednisone 20mg  daily x 2 qwks RTC on month.

## 2011-11-16 LAB — CBC WITH DIFFERENTIAL/PLATELET
Basophils Relative: 0 % (ref 0–1)
HCT: 43.2 % (ref 39.0–52.0)
Hemoglobin: 14.9 g/dL (ref 13.0–17.0)
Lymphs Abs: 1.5 10*3/uL (ref 0.7–4.0)
MCH: 31.3 pg (ref 26.0–34.0)
MCHC: 34.5 g/dL (ref 30.0–36.0)
Monocytes Absolute: 0.7 10*3/uL (ref 0.1–1.0)
Monocytes Relative: 9 % (ref 3–12)
Neutro Abs: 6.2 10*3/uL (ref 1.7–7.7)
Neutrophils Relative %: 72 % (ref 43–77)
RBC: 4.76 MIL/uL (ref 4.22–5.81)

## 2011-11-18 LAB — RPR

## 2011-11-19 LAB — COMPREHENSIVE METABOLIC PANEL

## 2011-11-19 LAB — LIPID PANEL

## 2011-11-20 ENCOUNTER — Other Ambulatory Visit: Payer: Self-pay | Admitting: Infectious Diseases

## 2011-11-20 DIAGNOSIS — Z79899 Other long term (current) drug therapy: Secondary | ICD-10-CM

## 2011-11-20 DIAGNOSIS — B20 Human immunodeficiency virus [HIV] disease: Secondary | ICD-10-CM

## 2011-11-21 ENCOUNTER — Other Ambulatory Visit (INDEPENDENT_AMBULATORY_CARE_PROVIDER_SITE_OTHER): Payer: Medicaid Other

## 2011-11-21 DIAGNOSIS — F17200 Nicotine dependence, unspecified, uncomplicated: Secondary | ICD-10-CM

## 2011-11-21 DIAGNOSIS — R319 Hematuria, unspecified: Secondary | ICD-10-CM

## 2011-11-21 DIAGNOSIS — Z79899 Other long term (current) drug therapy: Secondary | ICD-10-CM

## 2011-11-21 DIAGNOSIS — B20 Human immunodeficiency virus [HIV] disease: Secondary | ICD-10-CM

## 2011-11-21 LAB — HIV-1 RNA QUANT-NO REFLEX-BLD
HIV 1 RNA Quant: <20 copies/mL (ref <20)
HIV-1 RNA Quant, Log: <1.3 {Log} (ref <1.30)

## 2011-11-21 LAB — COMPREHENSIVE METABOLIC PANEL
Albumin: 4.1 g/dL (ref 3.5–5.2)
Alkaline Phosphatase: 59 U/L (ref 39–117)
BUN: 23 mg/dL (ref 6–23)
CO2: 24 mEq/L (ref 19–32)
Calcium: 9.1 mg/dL (ref 8.4–10.5)
Chloride: 103 mEq/L (ref 96–112)
Glucose, Bld: 115 mg/dL — ABNORMAL HIGH (ref 70–99)
Potassium: 3.6 mEq/L (ref 3.5–5.3)
Sodium: 138 mEq/L (ref 135–145)
Total Protein: 6.9 g/dL (ref 6.0–8.3)

## 2011-11-21 LAB — LIPID PANEL
Cholesterol: 214 mg/dL — ABNORMAL HIGH (ref 0–200)
HDL: 49 mg/dL (ref >39)
LDL Cholesterol: 92 mg/dL (ref 0–99)
Triglycerides: 363 mg/dL — ABNORMAL HIGH (ref <150)

## 2011-11-27 ENCOUNTER — Telehealth: Payer: Self-pay | Admitting: *Deleted

## 2011-11-27 NOTE — Telephone Encounter (Signed)
He has an appt  In early feb. Wanted hiv labs & lipid results. Given & discussed. Advised on diet & to discuss lipids at feb visit with md

## 2011-12-03 ENCOUNTER — Telehealth: Payer: Self-pay | Admitting: *Deleted

## 2011-12-03 NOTE — Telephone Encounter (Signed)
Patient called requesting referral to dermatology for psoriasis. Appointment made at [facility] with Dr. Rolena Infante for 12/04/11 at 10:20 AM. Office note faxed to [phone].  Patient given appointment date, time and address. Wendall Mola CMA

## 2011-12-06 ENCOUNTER — Ambulatory Visit: Payer: Medicaid Other | Admitting: Infectious Diseases

## 2011-12-13 ENCOUNTER — Other Ambulatory Visit: Payer: Self-pay | Admitting: *Deleted

## 2011-12-13 DIAGNOSIS — M1990 Unspecified osteoarthritis, unspecified site: Secondary | ICD-10-CM

## 2011-12-13 DIAGNOSIS — R52 Pain, unspecified: Secondary | ICD-10-CM

## 2011-12-13 MED ORDER — OXYCODONE-ACETAMINOPHEN 5-325 MG PO TABS: 2.0000 | ORAL_TABLET | Freq: Two times a day (BID) | ORAL | Status: DC

## 2011-12-13 NOTE — Telephone Encounter (Signed)
Pt called to request pain med refill. Printed & given to md to sign. Called pt back to let him know to come for it later

## 2011-12-20 ENCOUNTER — Telehealth: Payer: Self-pay | Admitting: Licensed Clinical Social Worker

## 2011-12-20 ENCOUNTER — Encounter: Payer: Self-pay | Admitting: Infectious Diseases

## 2011-12-20 ENCOUNTER — Ambulatory Visit (INDEPENDENT_AMBULATORY_CARE_PROVIDER_SITE_OTHER): Payer: Medicaid Other | Admitting: Infectious Diseases

## 2011-12-20 ENCOUNTER — Ambulatory Visit: Payer: Medicaid Other | Admitting: Infectious Diseases

## 2011-12-20 VITALS — BP 115/77 | HR 87 | Temp 97.6°F | Wt 157.0 lb

## 2011-12-20 DIAGNOSIS — L4050 Arthropathic psoriasis, unspecified: Secondary | ICD-10-CM

## 2011-12-20 MED ORDER — OXYCODONE-ACETAMINOPHEN 10-325 MG PO TABS: 1.0000 | ORAL_TABLET | ORAL | Status: DC | PRN

## 2011-12-20 NOTE — Progress Notes (Signed)
Patient ID: Garrett Schroeder, male   DOB: 12/05/1970, 41 y.o.   MRN: [id number] Kin is here for followup of his HIV infection that has been complicated by psoriasis and psoriatic arthritis of fingers and toes. His HIV is now controlled on darunavir,ritonavir, and Truvada with HIV <20 and CD4 430. He has been on prednisone 20mg  daily and will start taper off. He is seeing Jennette Kettle, MD for his psoriasis that is being treated with topical agents. His psoriasis is much worse over past two months despite  HIV being  better controlled. His ARV regimen is complicated because of previous intermittent use of antivirals but he is taking his faithfully and his numbers corroborate this. Right now his psoriasis is biggest issue. Garrett Schroeder has been able to stop smoking for 3 months and he tells me the truth and tend to believe him. He has f/u appoinment with Dr. Daphine Deutscher next month. Exam: BP 115/77  Pulse 87  Temp(Src) 97.6 F (36.4 C) (Oral)  Wt 157 lb (71.215 kg) No thrush but diffuse total body activity of plaque psoriasis and active arthritis of toes. He also has achilles tendon involvement and pain. Assessment: HIV controlled and continue current ARV regiment Active plaque psoriasis and will f/u with Dr. Daphine Deutscher. Will see again in 8-10 weeks Garrett Schroeder

## 2011-12-20 NOTE — Telephone Encounter (Signed)
Patient was given 3 rx's handwritten by Dr. Maurice March so the patient would not have to come each month for the next 3 months to pick up rx for percocet. One is do not fill until 01/17/2012, 02/17/2012, 5/14,2013. Patient's contract will be changed.

## 2012-01-07 ENCOUNTER — Telehealth: Payer: Self-pay | Admitting: *Deleted

## 2012-01-07 ENCOUNTER — Other Ambulatory Visit: Payer: Self-pay | Admitting: Infectious Diseases

## 2012-01-07 DIAGNOSIS — L400 Psoriasis vulgaris: Secondary | ICD-10-CM

## 2012-01-07 NOTE — Telephone Encounter (Signed)
States he kept appt with Dr. Elease Etienne but was displeased with him. Wants to be referred to [place] in [address] To E. Epperson RN to handle.  I told him it may take a few days before we can get him an appt

## 2012-01-21 ENCOUNTER — Telehealth: Payer: Self-pay | Admitting: Internal Medicine

## 2012-01-21 NOTE — Telephone Encounter (Signed)
I received a call from the treating dermatologist in [facility] for this patients psoriasis.  They are uncomfortable treating this 042 patient with immunosuppressives for psoriasis and recommend referral to a university setting like [facility]. He has [place] [facility] so needs  A referral from his PCP, Dr. Maurice March directly.  Please arrange.  Thanks The patients number is [phone].

## 2012-01-22 ENCOUNTER — Other Ambulatory Visit: Payer: Self-pay | Admitting: Licensed Clinical Social Worker

## 2012-01-22 DIAGNOSIS — B20 Human immunodeficiency virus [HIV] disease: Secondary | ICD-10-CM

## 2012-01-30 ENCOUNTER — Encounter: Payer: Self-pay | Admitting: *Deleted

## 2012-01-30 ENCOUNTER — Telehealth: Payer: Self-pay | Admitting: *Deleted

## 2012-01-30 NOTE — Telephone Encounter (Signed)
Called patient to see if ok to refer him to [facility] for dermatology appt. He stated he definitely wants to be seen. Pt told me that he is leaving for [facility] on 02/19/12 and does not know when he will return. He asked when his lab appt and MD appt was; I told him the times and he said he needs to cancel these and reschedule. He wanted to come in next week but this would not be enough time to get labs and be seen by Dr. Maurice March. I informed pt that he needs to call us and schedule an appointment when he knows he is coming back to [place]. Pt verbalized understanding. I told him I would try to get an appointment with [facility] before he goes to [place]. Will call patient tomorrow when I hear back from [facility]. Garrett Heap RN

## 2012-02-04 ENCOUNTER — Other Ambulatory Visit: Payer: Self-pay | Admitting: Infectious Diseases

## 2012-02-04 DIAGNOSIS — B20 Human immunodeficiency virus [HIV] disease: Secondary | ICD-10-CM

## 2012-02-05 ENCOUNTER — Other Ambulatory Visit: Payer: Self-pay | Admitting: *Deleted

## 2012-02-05 DIAGNOSIS — B20 Human immunodeficiency virus [HIV] disease: Secondary | ICD-10-CM

## 2012-02-05 MED ORDER — DARUNAVIR ETHANOLATE 600 MG PO TABS: 600.0000 mg | ORAL_TABLET | Freq: Every day | ORAL | Status: DC

## 2012-02-05 MED ORDER — EMTRICITABINE-TENOFOVIR DF 200-300 MG PO TABS: 1.0000 | ORAL_TABLET | Freq: Every day | ORAL | Status: DC

## 2012-02-05 MED ORDER — RALTEGRAVIR POTASSIUM 400 MG PO TABS: 400.0000 mg | ORAL_TABLET | Freq: Two times a day (BID) | ORAL | Status: DC

## 2012-02-05 MED ORDER — RITONAVIR 100 MG PO CAPS: 100.0000 mg | ORAL_CAPSULE | Freq: Two times a day (BID) | ORAL | Status: DC

## 2012-02-18 NOTE — Progress Notes (Signed)
Patient ID: Garrett Schroeder, male   DOB: [birth year], 41 y.o.   MRN: [id number] Spoke with dermatologist re: referral to [facility].  She was upset that it had been over a month and no referral had been made.  I informed her that our referral coordinator had been working on getting an appointment for him at [facility].  She stated that it doesn't take a month to get an appointment with them.  I informed her that this had not been our experience.  I told her we would continue to try to get him an appointment there.  She states that the patient has moved and there was no need to get him an appointment. Pearletha Alfred

## 2012-02-19 ENCOUNTER — Encounter: Payer: Self-pay | Admitting: *Deleted

## 2012-02-20 NOTE — Progress Notes (Signed)
Patient ID: Garrett Schroeder, male   DOB: September 21, 1971, 41 y.o.   MRN: [id number]  Pt was scheduled with a dermatologist; Not one but two appointments. Pt was seen by [facility] 12/04/11 and then by [facility] on 01/17/12. Pt was displeased with results of appointments and wanted to be referred to another dermatologist [facility]. I had faxed referral to [facility] 01/30/12 and have left messages, but have not had any responses or a scheduled appt. Did continue to call until appt made or a conclusion is met but someone that [facility] had spoke to stated he was out of town and no longer needed the third dermatology appt. Tacey Heap RN

## 2012-02-28 ENCOUNTER — Other Ambulatory Visit: Payer: Medicaid Other

## 2012-02-28 DIAGNOSIS — B20 Human immunodeficiency virus [HIV] disease: Secondary | ICD-10-CM

## 2012-02-28 LAB — COMPREHENSIVE METABOLIC PANEL
Albumin: 4.7 g/dL (ref 3.5–5.2)
Alkaline Phosphatase: 59 U/L (ref 39–117)
CO2: 25 mEq/L (ref 19–32)
Calcium: 9.5 mg/dL (ref 8.4–10.5)
Chloride: 105 mEq/L (ref 96–112)
Glucose, Bld: 98 mg/dL (ref 70–99)
Potassium: 4.1 mEq/L (ref 3.5–5.3)
Sodium: 137 mEq/L (ref 135–145)
Total Protein: 7.3 g/dL (ref 6.0–8.3)

## 2012-02-28 LAB — CBC WITH DIFFERENTIAL/PLATELET
Basophils Relative: 0 % (ref 0–1)
HCT: 44.7 % (ref 39.0–52.0)
Hemoglobin: 15.2 g/dL (ref 13.0–17.0)
Lymphocytes Relative: 17 % (ref 12–46)
Lymphs Abs: 1.9 10*3/uL (ref 0.7–4.0)
Monocytes Absolute: 1.2 10*3/uL — ABNORMAL HIGH (ref 0.1–1.0)
Monocytes Relative: 11 % (ref 3–12)
Neutro Abs: 8.1 10*3/uL — ABNORMAL HIGH (ref 1.7–7.7)
Neutrophils Relative %: 71 % (ref 43–77)
RBC: 4.61 MIL/uL (ref 4.22–5.81)
WBC: 11.4 10*3/uL — ABNORMAL HIGH (ref 4.0–10.5)

## 2012-03-03 LAB — HIV-1 RNA QUANT-NO REFLEX-BLD: HIV-1 RNA Quant, Log: <1.3 {Log} (ref <1.30)

## 2012-03-13 ENCOUNTER — Ambulatory Visit (INDEPENDENT_AMBULATORY_CARE_PROVIDER_SITE_OTHER): Payer: Medicaid Other | Admitting: Infectious Diseases

## 2012-03-13 ENCOUNTER — Encounter: Payer: Self-pay | Admitting: Infectious Diseases

## 2012-03-13 ENCOUNTER — Ambulatory Visit: Payer: Medicaid Other | Admitting: Infectious Diseases

## 2012-03-13 VITALS — BP 121/79 | HR 81 | Temp 98.6°F | Wt 162.0 lb

## 2012-03-13 DIAGNOSIS — B20 Human immunodeficiency virus [HIV] disease: Secondary | ICD-10-CM

## 2012-03-13 DIAGNOSIS — L4050 Arthropathic psoriasis, unspecified: Secondary | ICD-10-CM

## 2012-03-13 MED ORDER — FLUOCINONIDE 0.05 % EX OINT: 1.0000 "application " | TOPICAL_OINTMENT | Freq: Every day | CUTANEOUS | Status: DC

## 2012-03-13 MED ORDER — OXYCODONE-ACETAMINOPHEN 10-325 MG PO TABS: 1.0000 | ORAL_TABLET | ORAL | Status: DC | PRN

## 2012-03-13 NOTE — Progress Notes (Signed)
Patient ID: SOLOMON SKOWRONEK, male   DOB: 1971-04-27, 41 y.o.   MRN: [id number] HIV F/U Visit  Damarion has been doing reasonably well and adheres to his antivirals faithfullly. He has not been regularly taking his topical steroids and will refill his  Lidex to apply daily. He continues on his Percocet for his psoriatic arthritis. His skin involvement with psoriasis is improved but still some trouble for him. The referral to [facility] dermatology group has not yet been completed and will work on doing so.     He has had improvement in his tenosynovitis of his feet and has been well enough to plan a trip to [facility] for the next three weeks.     His HIV PCR is undetectable at < 20 copies/ml. ROS No weight loss, fevers or cough. Still smoking including marijauna. No changes in bowel habits. BP 121/79  Pulse 81  Temp(Src) 98.6 F (37 C) (Oral)  Wt 162 lb (73.483 kg) No distress and has gained weight. 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 Psoriatic plaques on elbows. Tenosynovitis of toes has resolved and nail beds are less pittted. Chest clear and no murmurs  Impressions HIV controlled  Psoriasis improving as HIV controlled Will try to complete referral to  [facility] dermatology, Lina Sayre

## 2012-04-04 ENCOUNTER — Telehealth: Payer: Self-pay | Admitting: *Deleted

## 2012-04-04 NOTE — Telephone Encounter (Signed)
Medicaid will not cover the fluocinonide ointment. It will cover the creme.  Triamcinalone is also a preferred drug. To Dr. Maurice March to decide which med he wants the pt to have

## 2012-04-09 ENCOUNTER — Other Ambulatory Visit: Payer: Self-pay | Admitting: Infectious Diseases

## 2012-04-09 DIAGNOSIS — B20 Human immunodeficiency virus [HIV] disease: Secondary | ICD-10-CM

## 2012-04-09 MED ORDER — FLUOCINONIDE-E 0.05 % EX CREA: TOPICAL_CREAM | Freq: Every day | CUTANEOUS | Status: DC

## 2012-04-09 NOTE — Telephone Encounter (Signed)
md on vacation. To another md to see if he will change the ointment to a creme

## 2012-05-29 ENCOUNTER — Other Ambulatory Visit: Payer: Medicaid Other

## 2012-06-13 ENCOUNTER — Telehealth: Payer: Self-pay | Admitting: *Deleted

## 2012-06-13 ENCOUNTER — Ambulatory Visit: Payer: Medicaid Other | Admitting: Infectious Diseases

## 2012-06-13 NOTE — Telephone Encounter (Signed)
Called patient to reschedule his appt, and was told he moved to [facility]. Wendall Mola CMA

## 2013-05-05 ENCOUNTER — Other Ambulatory Visit: Payer: Medicaid Other

## 2013-05-05 DIAGNOSIS — Z113 Encounter for screening for infections with a predominantly sexual mode of transmission: Secondary | ICD-10-CM

## 2013-05-05 DIAGNOSIS — B20 Human immunodeficiency virus [HIV] disease: Secondary | ICD-10-CM

## 2013-05-05 LAB — CBC WITH DIFFERENTIAL/PLATELET
Eosinophils Relative: 1 % (ref 0–5)
HCT: 40.8 % (ref 39.0–52.0)
Lymphocytes Relative: 17 % (ref 12–46)
Lymphs Abs: 1.7 10*3/uL (ref 0.7–4.0)
MCV: 88.1 fL (ref 78.0–100.0)
Monocytes Absolute: 1.1 10*3/uL — ABNORMAL HIGH (ref 0.1–1.0)
Neutro Abs: 7.1 10*3/uL (ref 1.7–7.7)
Platelets: 335 10*3/uL (ref 150–400)
RBC: 4.63 MIL/uL (ref 4.22–5.81)
WBC: 9.9 10*3/uL (ref 4.0–10.5)

## 2013-05-05 LAB — COMPREHENSIVE METABOLIC PANEL
ALT: 27 U/L (ref 0–53)
Albumin: 4.3 g/dL (ref 3.5–5.2)
CO2: 28 mEq/L (ref 19–32)
Calcium: 9.6 mg/dL (ref 8.4–10.5)
Chloride: 98 mEq/L (ref 96–112)
Creat: 0.95 mg/dL (ref 0.50–1.35)
Potassium: 4.3 mEq/L (ref 3.5–5.3)

## 2013-05-05 NOTE — Addendum Note (Signed)
Addended bySteva Colder on: 05/05/2013 11:56 AM   Modules accepted: Orders

## 2013-05-06 LAB — T-HELPER CELL (CD4) - (RCID CLINIC ONLY): CD4 % Helper T Cell: 26 % — ABNORMAL LOW (ref 33–55)

## 2013-05-06 LAB — HIV-1 RNA QUANT-NO REFLEX-BLD
HIV 1 RNA Quant: 3572 copies/mL — ABNORMAL HIGH (ref <20)
HIV-1 RNA Quant, Log: 3.55 {Log} — ABNORMAL HIGH (ref <1.30)

## 2013-05-19 ENCOUNTER — Encounter: Payer: Self-pay | Admitting: Internal Medicine

## 2013-05-19 ENCOUNTER — Ambulatory Visit (INDEPENDENT_AMBULATORY_CARE_PROVIDER_SITE_OTHER): Payer: Medicaid Other | Admitting: Internal Medicine

## 2013-05-19 VITALS — BP 94/60 | HR 87 | Temp 98.2°F | Ht 66.0 in | Wt 139.0 lb

## 2013-05-19 DIAGNOSIS — B20 Human immunodeficiency virus [HIV] disease: Secondary | ICD-10-CM

## 2013-05-19 DIAGNOSIS — F17200 Nicotine dependence, unspecified, uncomplicated: Secondary | ICD-10-CM

## 2013-05-19 DIAGNOSIS — L4050 Arthropathic psoriasis, unspecified: Secondary | ICD-10-CM | POA: Insufficient documentation

## 2013-05-19 MED ORDER — RALTEGRAVIR POTASSIUM 400 MG PO TABS: 400.0000 mg | ORAL_TABLET | Freq: Two times a day (BID) | ORAL | Status: DC

## 2013-05-19 MED ORDER — FLUOCINONIDE-E 0.05 % EX CREA: TOPICAL_CREAM | Freq: Every day | CUTANEOUS | Status: DC

## 2013-05-19 MED ORDER — IBUPROFEN 800 MG PO TABS: 800.0000 mg | ORAL_TABLET | Freq: Three times a day (TID) | ORAL | Status: DC | PRN

## 2013-05-19 MED ORDER — EMTRICITABINE-TENOFOVIR DF 200-300 MG PO TABS: 1.0000 | ORAL_TABLET | Freq: Every day | ORAL | Status: DC

## 2013-05-19 MED ORDER — DARUNAVIR ETHANOLATE 600 MG PO TABS: 600.0000 mg | ORAL_TABLET | Freq: Two times a day (BID) | ORAL | Status: DC

## 2013-05-19 MED ORDER — FLUOCINONIDE 0.05 % EX OINT: 1.0000 "application " | TOPICAL_OINTMENT | Freq: Every day | CUTANEOUS | Status: DC

## 2013-05-19 MED ORDER — RITONAVIR 100 MG PO CAPS: 100.0000 mg | ORAL_CAPSULE | Freq: Two times a day (BID) | ORAL | Status: DC

## 2013-05-19 NOTE — Assessment & Plan Note (Signed)
He has a long-standing history. He is not on any suppressive therapy though I do think there is more experience in treatment in patients with HIV that is well controlled. I did offer referral to dermatology however he refused. I did offer ibuprofen and recommended he take it no more than 2-3 times daily, one pill. He voiced his understanding. He did ask for narcotic pain medications however I explained that this is not indicated and he should discuss with dermatology regarding more definitive therapy.

## 2013-05-19 NOTE — Progress Notes (Signed)
HPI: Garrett Schroeder is a 42 y.o. male here to re-establish care at [facility].  Allergies: Allergies  Allergen Reactions  . Codeine     Vitals: Temp: 98.2 F (36.8 C) (07/15 1044) Temp src: Oral (07/15 1044) BP: 94/60 mmHg (07/15 1044) Pulse Rate: 87 (07/15 1044)  Past Medical History: No past medical history on file.  Social History: History   Social History  . Marital Status: Single    Spouse Name: N/A    Number of Children: N/A  . Years of Education: N/A   Social History Main Topics  . Smoking status: Current Every Day Smoker -- 0.75 packs/day for 30 years    Types: Cigarettes  . Smokeless tobacco: None     Comment: interested in starting Chantix  . Alcohol Use: 2.5 oz/week    5 drink(s) per week  . Drug Use: 20.00 per week    Special: Marijuana  . Sexually Active: Not Currently -- Male partner(s)     Comment: declined condoms   Other Topics Concern  . None   Social History Narrative  . None    Previous Regimen: Darunavir/ritonavir, Raltegravir, Truvada  Current Regimen: Off medications x 1 year  Labs: HIV 1 RNA Quant (copies/mL)  Date Value  05/05/2013 3572*  02/28/2012 <20   11/15/2011 <20      CD4 T Cell Abs (cmm)  Date Value  05/05/2013 410   02/28/2012 680   11/15/2011 430      Hep B S Ab (no units)  Date Value  12/30/2006 NO      Hepatitis B Surface Ag (no units)  Date Value  02/11/2008 NEGATIVE      HCV Ab (no units)  Date Value  02/11/2008 NEGATIVE     CrCl: Estimated Creatinine Clearance: 91.3 ml/min (by C-G formula based on Cr of 0.95).  Lipids:    Component Value Date/Time   CHOL 214* 11/21/2011 0946   TRIG 363* 11/21/2011 0946   HDL 49 11/21/2011 0946   CHOLHDL 4.4 11/21/2011 0946   VLDL 73* 11/21/2011 0946   LDLCALC 92 11/21/2011 0946    Assessment: HIV - uncontrolled due to absence from care x 1 year.  He is to restart his previous regimen of Darunavir/ritonavir, Raltegravir, Truvada.  Counseling was offered but refused by  patient.  Recommendations: Will follow-up at his next visit for any adherence counseling needs.  Encouraged him to call if he had any questions or needs regarding his medications.  Madolyn Frieze, PharmD Clinical Infectious Disease Pharmacist [facility] 05/19/2013, 11:32 AM

## 2013-05-19 NOTE — Progress Notes (Signed)
  Subjective:    Patient ID: Garrett Schroeder, male    DOB: 19-Nov-1970, 42 y.o.   MRN: [id number]  HPI He comes in today to reestablish care for his HIV.  He has been on a regimen of Prezista twice a day, Norvir twice a day, Truvada and Isentress. He has been in [place] over the past year though he also tells me he was in [facility] for a while and was unable to find an HIV clinic. He has been off of medications of the last 2 months due to lack of refills. He denies any history of opportunistic infections. He is unsure of his last CD4 count at the other clinic.  He also has a history of psoriatic arthritis. He tells me he has been to a dermatologist previously in [place] and they opted not to give him any injection medications do to his concurrent HIV. He has considerable pain he does tell me he takes 2-3 800 mg ibuprofen 3 times a day to 2 significant pain. He previously got his pain medications at his other clinic also tells me that because of a missed appointment he was refused further pain medications. He has a history of drug use but tells me he is currently drug free.  He is asking for pain medications. He uses this to treat his psoriatic arthritis. He also is asking for his topical therapy.   Review of Systems  Constitutional: Negative for fever, fatigue and unexpected weight change.  HENT: Negative for sore throat and mouth sores.   Eyes: Negative for photophobia and visual disturbance.  Respiratory: Negative for cough and shortness of breath.   Cardiovascular: Negative for chest pain.  Gastrointestinal: Negative for diarrhea and constipation.  Genitourinary: Negative for discharge.  Musculoskeletal: Positive for myalgias, back pain, joint swelling and arthralgias.  Skin: Positive for rash.  Neurological: Negative for dizziness, light-headedness and headaches.  Hematological: Negative for adenopathy.  Psychiatric/Behavioral: Negative for dysphoric mood.       Objective:   Physical  Exam  Constitutional: He appears well-developed and well-nourished. No distress.  HENT:  Mouth/Throat: No oropharyngeal exudate.  Cardiovascular: Normal rate, regular rhythm and normal heart sounds.   No murmur heard. Pulmonary/Chest: Effort normal and breath sounds normal. No respiratory distress. He has no wheezes.  Abdominal: Soft. Bowel sounds are normal. He exhibits no distension. There is no tenderness.  Lymphadenopathy:    He has no cervical adenopathy.  Skin: Rash noted.          Assessment & Plan:

## 2013-05-19 NOTE — Assessment & Plan Note (Signed)
I will have him restart his previous regimen and recheck his labs one month after starting. I will followup within the week to 2 weeks after he gets his labs. I increased compliance and discussed the mechanism of resistance

## 2013-05-19 NOTE — Assessment & Plan Note (Signed)
I discussed cessation. He is not currently interested.

## 2013-05-20 ENCOUNTER — Encounter: Payer: Self-pay | Admitting: Internal Medicine

## 2013-05-22 ENCOUNTER — Telehealth: Payer: Self-pay | Admitting: Licensed Clinical Social Worker

## 2013-05-22 NOTE — Telephone Encounter (Signed)
Patient wants a referral to [facility] pain center for his arthritis, he states he is suffering in pain.

## 2013-05-25 ENCOUNTER — Other Ambulatory Visit: Payer: Self-pay | Admitting: Licensed Clinical Social Worker

## 2013-05-25 DIAGNOSIS — L4050 Arthropathic psoriasis, unspecified: Secondary | ICD-10-CM

## 2013-05-25 NOTE — Telephone Encounter (Signed)
OK 

## 2013-06-01 ENCOUNTER — Telehealth: Payer: Self-pay | Admitting: *Deleted

## 2013-06-01 ENCOUNTER — Telehealth: Payer: Self-pay | Admitting: Licensed Clinical Social Worker

## 2013-06-01 NOTE — Telephone Encounter (Signed)
Patient called wanting to know the status of his referral. He has called several times today and I explained to him that I faxed the information last week and they will call him when they have an appointment. I gave him the phone number to [facility] in [facility]. He called back again and asked about going to [facility], I gave him the number and explained that he would need to sign a release of information so we could send them his information. He stated he was "tired of Doctor's in [place]" and hung up.

## 2013-06-01 NOTE — Telephone Encounter (Signed)
Pt called to check on his pain clinic referral.  Per Tamika, referral with 3 last office notes sent 05/25/13.  Patient had been given the phone number to [facility] last week so that he may check the status himself. Andree Coss, RN

## 2013-06-02 ENCOUNTER — Telehealth: Payer: Self-pay | Admitting: *Deleted

## 2013-06-02 NOTE — Telephone Encounter (Signed)
Pt called and confirmed that he could come into the office and sign a Release of Information form so that he may transfer his HIV care to another MD.  Pt may take the form with him to his new MD and have them fax [place] the signed request with their clinic information. Andree Coss, RN

## 2013-06-09 ENCOUNTER — Telehealth: Payer: Self-pay | Admitting: Licensed Clinical Social Worker

## 2013-06-09 NOTE — Telephone Encounter (Signed)
Fax received that patient has an appointment with [facility] [place], patient notified

## 2013-10-28 ENCOUNTER — Other Ambulatory Visit: Payer: Self-pay | Admitting: Internal Medicine

## 2014-03-15 ENCOUNTER — Telehealth: Payer: Self-pay

## 2014-03-15 NOTE — Telephone Encounter (Signed)
Referral sent from [facility] for transfer. Patient stated he was no longer able to make the drive [facility].  I spoke with Garrett Schroeder and informed her the patient transferred to [facility] because he was not pleased with our care.  Garrett Schroeder was his previous ID physician and he retired. When Garrett Schroeder transferred to a different physician we had problems with him requesting narcotic medications and our physician were not will to write scripts.  For those with chronic pain issues we refer to pain specialist for treatment.  If patient returns he should be informed our policy is still in place.  I will check with our Practice Manger to assure we will accept him as a returning patient.   Garrett Josephsammy K Kennie Snedden, RN

## 2014-03-24 NOTE — Telephone Encounter (Signed)
Message copied by Laurell JosephsKING, Lalaine Overstreet K on Wed Mar 24, 2014  1:42 PM ------      Message from: [place], [place] K      Created: Mon Mar 15, 2014  3:56 PM       Patient would like to transfer .       Speak with Asher MuirJamie  ------

## 2014-03-24 NOTE — Telephone Encounter (Signed)
Per Practice Manager it is okay for patient to transfer as long as he knows we will not prescribe narcotic medications.   Laurell Josephsammy K King, RN

## 2014-04-06 ENCOUNTER — Other Ambulatory Visit: Payer: Medicaid Other

## 2014-04-06 DIAGNOSIS — B20 Human immunodeficiency virus [HIV] disease: Secondary | ICD-10-CM

## 2014-04-06 LAB — CBC WITH DIFFERENTIAL/PLATELET
BASOS ABS: 0 10*3/uL (ref 0.0–0.1)
BASOS PCT: 0 % (ref 0–1)
EOS ABS: 0.1 10*3/uL (ref 0.0–0.7)
Eosinophils Relative: 2 % (ref 0–5)
HCT: 43.3 % (ref 39.0–52.0)
HEMOGLOBIN: 15.2 g/dL (ref 13.0–17.0)
Lymphocytes Relative: 24 % (ref 12–46)
Lymphs Abs: 1.6 10*3/uL (ref 0.7–4.0)
MCH: 31.2 pg (ref 26.0–34.0)
MCHC: 35.1 g/dL (ref 30.0–36.0)
MCV: 88.9 fL (ref 78.0–100.0)
MONO ABS: 0.9 10*3/uL (ref 0.1–1.0)
MONOS PCT: 13 % — AB (ref 3–12)
NEUTROS ABS: 4.1 10*3/uL (ref 1.7–7.7)
NEUTROS PCT: 61 % (ref 43–77)
Platelets: 223 10*3/uL (ref 150–400)
RBC: 4.87 MIL/uL (ref 4.22–5.81)
RDW: 14.3 % (ref 11.5–15.5)
WBC: 6.7 10*3/uL (ref 4.0–10.5)

## 2014-04-06 LAB — COMPLETE METABOLIC PANEL WITH GFR
ALBUMIN: 4.3 g/dL (ref 3.5–5.2)
ALT: 15 U/L (ref 0–53)
AST: 16 U/L (ref 0–37)
Alkaline Phosphatase: 61 U/L (ref 39–117)
BUN: 21 mg/dL (ref 6–23)
CHLORIDE: 103 meq/L (ref 96–112)
CO2: 27 meq/L (ref 19–32)
Calcium: 9.7 mg/dL (ref 8.4–10.5)
Creat: 0.98 mg/dL (ref 0.50–1.35)
GLUCOSE: 95 mg/dL (ref 70–99)
POTASSIUM: 4.4 meq/L (ref 3.5–5.3)
SODIUM: 138 meq/L (ref 135–145)
TOTAL PROTEIN: 7.4 g/dL (ref 6.0–8.3)
Total Bilirubin: 0.4 mg/dL (ref 0.2–1.2)

## 2014-04-07 LAB — T-HELPER CELL (CD4) - (RCID CLINIC ONLY)
CD4 % Helper T Cell: 23 % — ABNORMAL LOW (ref 33–55)
CD4 T Cell Abs: 400 /uL (ref 400–2700)

## 2014-04-07 LAB — HIV-1 RNA QUANT-NO REFLEX-BLD
HIV 1 RNA Quant: 25844 copies/mL — ABNORMAL HIGH (ref <20)
HIV-1 RNA QUANT, LOG: 4.41 {Log} — AB (ref <1.30)

## 2014-04-20 ENCOUNTER — Telehealth: Payer: Self-pay | Admitting: *Deleted

## 2014-04-20 ENCOUNTER — Ambulatory Visit (INDEPENDENT_AMBULATORY_CARE_PROVIDER_SITE_OTHER): Payer: Medicaid Other | Admitting: Internal Medicine

## 2014-04-20 ENCOUNTER — Encounter: Payer: Self-pay | Admitting: Internal Medicine

## 2014-04-20 VITALS — BP 110/81 | HR 83 | Temp 98.3°F | Ht 66.0 in | Wt 155.0 lb

## 2014-04-20 DIAGNOSIS — H6120 Impacted cerumen, unspecified ear: Secondary | ICD-10-CM

## 2014-04-20 DIAGNOSIS — R079 Chest pain, unspecified: Secondary | ICD-10-CM

## 2014-04-20 DIAGNOSIS — F17200 Nicotine dependence, unspecified, uncomplicated: Secondary | ICD-10-CM

## 2014-04-20 DIAGNOSIS — B20 Human immunodeficiency virus [HIV] disease: Secondary | ICD-10-CM

## 2014-04-20 MED ORDER — DARUNAVIR ETHANOLATE 800 MG PO TABS: 800.0000 mg | ORAL_TABLET | Freq: Every day | ORAL | Status: DC

## 2014-04-20 MED ORDER — DOLUTEGRAVIR SODIUM 50 MG PO TABS: 50.0000 mg | ORAL_TABLET | Freq: Every day | ORAL | Status: DC

## 2014-04-20 MED ORDER — RITONAVIR 100 MG PO TABS: 100.0000 mg | ORAL_TABLET | Freq: Every day | ORAL | Status: DC

## 2014-04-20 MED ORDER — EMTRICITABINE-TENOFOVIR DF 200-300 MG PO TABS: 1.0000 | ORAL_TABLET | Freq: Every day | ORAL | Status: DC

## 2014-04-20 NOTE — Assessment & Plan Note (Signed)
I discussed the need for compliance and if he is interested in taking daily or prefers to defer treatment.  He tells me he wants to take daily and get suppressed.  I discussed resistance.  He will return in 1 month for labs and appt same day on medications.

## 2014-04-20 NOTE — Progress Notes (Signed)
  Subjective:    Patient ID: Garrett Schroeder, male    DOB: 08/06/1971, 43 y.o.   MRN: [id number]  HPI  He comes in today to reestablish care for his HIV.  He was here 1 year ago but did not return since I would not prescribe pain medications. [facility] then [facility] and started back on Prezista, norvir, Isentress bid and Truvada daily.  He has had very poor compliance and adherence.  When asked he states that he feels fine and doesn't need to take it daily. He does tell me he understands resistance.  [place] previously was in [place] over the past year though he also tells me he was in [facility] [place] a while and was unable to find an HIV clinic.  He has been off his ARVs for at least 2 months. [facility] recent labs at [facility] did not show a suppressed virus.       He also has a history of psoriatic arthritis. He was sent to a pain clinic for his chronic pain by [facility] but was let go after his drug test was positive for marijuana.  He has multiple complaints including difficulty hearing due to ear wax, chest pain that occurs at night and pain.  Ready to quit smoking.    Review of Systems  Constitutional: Negative for fever, fatigue and unexpected weight change.  HENT: Negative for mouth sores and sore throat.   Eyes: Negative for photophobia and visual disturbance.  Respiratory: Negative for cough and shortness of breath.   Cardiovascular: Positive for chest pain.       Occurs at night, sharp  Gastrointestinal: Negative for diarrhea and constipation.  Genitourinary: Negative for discharge.  Musculoskeletal: Positive for arthralgias, back pain, joint swelling and myalgias.  Skin: Negative for rash.  Neurological: Negative for dizziness, light-headedness and headaches.  Hematological: Negative for adenopathy.  Psychiatric/Behavioral: Negative for dysphoric mood.       Objective:   Physical Exam  Constitutional: He appears well-developed and well-nourished. No distress.   HENT:  Mouth/Throat: No oropharyngeal exudate.  Eyes: No scleral icterus.  Cardiovascular: Normal rate, regular rhythm and normal heart sounds.   No murmur heard. Pulmonary/Chest: Effort normal and breath sounds normal. No respiratory distress. He has no wheezes.  Abdominal: Soft. Bowel sounds are normal. He exhibits no distension. There is no tenderness.  Musculoskeletal: He exhibits no edema.  Lymphadenopathy:    He has no cervical adenopathy.  Skin: No rash noted.  Psychiatric: He has a normal mood and affect.          Assessment & Plan:

## 2014-04-20 NOTE — Telephone Encounter (Signed)
Patient given PCP phone numbers for those providers currently accepting medicaid in the [place] [facility] area. [facility] ([facility] and Reola Calk[facility]): [phone] [facility]: [phone] [facility]: [phone]  Pt will contact these providers to set up primary care, also will seek referrals from them for stress test, pain management. Andree CossHowell, Michelle M, RN

## 2014-04-20 NOTE — Assessment & Plan Note (Signed)
Atypical with sharp pain but concerning enough.  I recommended to go to ED for evaluation. Patient refused.  Will discuss with PCP for possible stress test.

## 2014-04-20 NOTE — Assessment & Plan Note (Signed)
intrerested in quitting and encouraged.  Will discuss with pcp

## 2014-04-20 NOTE — Patient Instructions (Signed)
Try Debrox drops in your ears for ear wax

## 2014-04-20 NOTE — Assessment & Plan Note (Signed)
He will try Debrox.

## 2014-05-31 ENCOUNTER — Other Ambulatory Visit: Payer: Self-pay | Admitting: Licensed Clinical Social Worker

## 2014-06-10 ENCOUNTER — Telehealth: Payer: Self-pay | Admitting: *Deleted

## 2014-06-10 NOTE — Telephone Encounter (Signed)
Patient left message that he wants to reschedule. Unable to come Tuesday 8/11. Micael HampshireMichelle Howell, RN

## 2014-06-11 ENCOUNTER — Telehealth: Payer: Self-pay | Admitting: *Deleted

## 2014-06-11 NOTE — Telephone Encounter (Signed)
Patient called back stating he needed to reschedule his appt because he has to provide transportation for his parent's doctors appointments. Rescheduled with Dr. Luciana Axeomer for 07/13/14. He is also requesting an Rx for chantix and a cream for psoriasis. He did not know the name of the cream. He states he does not have a PCP even though [place] has [place]. Advised him to call the MD listed on his card and request an appointment. Note sent to Dr. Luciana Axeomer regarding Rx for Chantix. Wendall MolaJacqueline Cockerham

## 2014-06-11 NOTE — Telephone Encounter (Signed)
Patient removed from schedule. Tried to call him to reschedule and his phone is not accepting calls.

## 2014-06-14 ENCOUNTER — Other Ambulatory Visit: Payer: Self-pay | Admitting: Internal Medicine

## 2014-06-14 MED ORDER — VARENICLINE TARTRATE 0.5 MG X 11 & 1 MG X 42 PO MISC: ORAL | Status: DC

## 2014-06-14 MED ORDER — VARENICLINE TARTRATE 0.5 MG PO TABS: 0.5000 mg | ORAL_TABLET | Freq: Two times a day (BID) | ORAL | Status: DC

## 2014-06-14 NOTE — Telephone Encounter (Signed)
Patient notified

## 2014-06-14 NOTE — Telephone Encounter (Signed)
Chantix has been sent

## 2014-06-15 ENCOUNTER — Ambulatory Visit: Payer: Medicaid Other | Admitting: Internal Medicine

## 2014-07-13 ENCOUNTER — Ambulatory Visit (INDEPENDENT_AMBULATORY_CARE_PROVIDER_SITE_OTHER): Payer: Medicaid Other | Admitting: Internal Medicine

## 2014-07-13 ENCOUNTER — Telehealth: Payer: Self-pay | Admitting: *Deleted

## 2014-07-13 ENCOUNTER — Encounter: Payer: Self-pay | Admitting: Internal Medicine

## 2014-07-13 VITALS — BP 112/75 | HR 79 | Temp 98.0°F | Wt 157.0 lb

## 2014-07-13 DIAGNOSIS — Z23 Encounter for immunization: Secondary | ICD-10-CM | POA: Diagnosis not present

## 2014-07-13 DIAGNOSIS — B20 Human immunodeficiency virus [HIV] disease: Secondary | ICD-10-CM

## 2014-07-13 LAB — COMPLETE METABOLIC PANEL WITH GFR
ALBUMIN: 4.7 g/dL (ref 3.5–5.2)
ALT: 12 U/L (ref 0–53)
AST: 13 U/L (ref 0–37)
Alkaline Phosphatase: 61 U/L (ref 39–117)
BUN: 26 mg/dL — ABNORMAL HIGH (ref 6–23)
CO2: 27 meq/L (ref 19–32)
Calcium: 10.1 mg/dL (ref 8.4–10.5)
Chloride: 103 mEq/L (ref 96–112)
Creat: 1.25 mg/dL (ref 0.50–1.35)
GFR, EST NON AFRICAN AMERICAN: 71 mL/min
GFR, Est African American: 82 mL/min
GLUCOSE: 94 mg/dL (ref 70–99)
POTASSIUM: 4.1 meq/L (ref 3.5–5.3)
Sodium: 139 mEq/L (ref 135–145)
Total Bilirubin: 0.5 mg/dL (ref 0.2–1.2)
Total Protein: 8.1 g/dL (ref 6.0–8.3)

## 2014-07-13 NOTE — Progress Notes (Signed)
  Subjective:    Patient ID: Garrett Schroeder, male    DOB: 1971-06-07, 43 y.o.   MRN: [id number]  HPI He comes in for follow up of HIV.  He was here in June after reestablishing care he after going to [facility] since I would not prescribe pain medications. He had been on Prezista, norvir, Isentress bid and Truvada daily.  He has had very poor compliance and adherence and I changed Isentress to Tivicay.  When asked he states that he feels fine and does take it daily now for 2 months.         He also has a history of psoriatic arthritis. He was sent to a pain clinic for his chronic pain by his physician at [facility] but was let go after his drug test was positive for marijuana.  He continues to complain of ear wax and wants a referral to ENT, but has [place] Access and needs to get it from his PCP.   Started Chantix but smoking again.    Review of Systems  Constitutional: Negative for fever, fatigue and unexpected weight change.  HENT: Negative for mouth sores and sore throat.   Eyes: Negative for photophobia and visual disturbance.  Cardiovascular: Negative for chest pain.  Gastrointestinal: Negative for diarrhea and constipation.  Genitourinary: Negative for discharge.  Musculoskeletal: Positive for arthralgias, back pain, joint swelling and myalgias.  Skin: Negative for rash.  Neurological: Negative for dizziness, light-headedness and headaches.  Hematological: Negative for adenopathy.  Psychiatric/Behavioral: Negative for dysphoric mood.       Objective:   Physical Exam  Constitutional: He appears well-developed and well-nourished. No distress.  HENT:  Mouth/Throat: No oropharyngeal exudate.  Eyes: No scleral icterus.  Cardiovascular: Normal rate, regular rhythm and normal heart sounds.   No murmur heard. Pulmonary/Chest: Effort normal and breath sounds normal. No respiratory distress. He has no wheezes.  Lymphadenopathy:    He has no cervical adenopathy.  Skin: No rash noted.           Assessment & Plan:

## 2014-07-13 NOTE — Assessment & Plan Note (Signed)
States he is taking it daily.  Will check labs today and rtc 3-4 months if reassuring.  He will be notified if any issues.

## 2014-07-13 NOTE — Telephone Encounter (Signed)
Referral sent to [facility] for patient to establish with PCP at [facility]. Garrett Schroeder

## 2014-07-14 LAB — CBC WITH DIFFERENTIAL/PLATELET
BASOS ABS: 0 10*3/uL (ref 0.0–0.1)
BASOS PCT: 0 % (ref 0–1)
Eosinophils Absolute: 0.1 10*3/uL (ref 0.0–0.7)
Eosinophils Relative: 1 % (ref 0–5)
HCT: 43.3 % (ref 39.0–52.0)
HEMOGLOBIN: 15.4 g/dL (ref 13.0–17.0)
Lymphocytes Relative: 19 % (ref 12–46)
Lymphs Abs: 1.9 10*3/uL (ref 0.7–4.0)
MCH: 31.6 pg (ref 26.0–34.0)
MCHC: 35.6 g/dL (ref 30.0–36.0)
MCV: 88.9 fL (ref 78.0–100.0)
MONOS PCT: 11 % (ref 3–12)
Monocytes Absolute: 1.1 10*3/uL — ABNORMAL HIGH (ref 0.1–1.0)
NEUTROS ABS: 7 10*3/uL (ref 1.7–7.7)
Neutrophils Relative %: 69 % (ref 43–77)
Platelets: 319 10*3/uL (ref 150–400)
RBC: 4.87 MIL/uL (ref 4.22–5.81)
RDW: 13.6 % (ref 11.5–15.5)
WBC: 10.2 10*3/uL (ref 4.0–10.5)

## 2014-07-14 LAB — HIV-1 RNA QUANT-NO REFLEX-BLD
HIV 1 RNA Quant: 191 copies/mL — ABNORMAL HIGH (ref <20)
HIV-1 RNA QUANT, LOG: 2.28 {Log} — AB (ref <1.30)

## 2014-07-14 LAB — T-HELPER CELL (CD4) - (RCID CLINIC ONLY)
CD4 T CELL HELPER: 26 % — AB (ref 33–55)
CD4 T Cell Abs: 540 /uL (ref 400–2700)

## 2014-07-20 ENCOUNTER — Other Ambulatory Visit: Payer: Self-pay

## 2014-07-20 DIAGNOSIS — L4050 Arthropathic psoriasis, unspecified: Secondary | ICD-10-CM

## 2014-07-20 DIAGNOSIS — B20 Human immunodeficiency virus [HIV] disease: Secondary | ICD-10-CM

## 2014-07-20 MED ORDER — IBUPROFEN 800 MG PO TABS: 800.0000 mg | ORAL_TABLET | Freq: Three times a day (TID) | ORAL | Status: DC | PRN

## 2014-07-20 MED ORDER — FLUOCINONIDE 0.05 % EX OINT: 1.0000 "application " | TOPICAL_OINTMENT | Freq: Every day | CUTANEOUS | Status: DC

## 2014-07-22 ENCOUNTER — Other Ambulatory Visit: Payer: Self-pay | Admitting: *Deleted

## 2014-07-22 NOTE — Telephone Encounter (Signed)
PA for Lidex 0.05% ointment initiated, ref#[id number].  Call [place] [facility] back in 24 hours for response [phone].

## 2014-08-18 ENCOUNTER — Encounter (INDEPENDENT_AMBULATORY_CARE_PROVIDER_SITE_OTHER): Payer: Self-pay

## 2014-08-18 ENCOUNTER — Ambulatory Visit (INDEPENDENT_AMBULATORY_CARE_PROVIDER_SITE_OTHER): Payer: Medicaid Other | Admitting: Family Medicine

## 2014-08-18 VITALS — BP 128/71 | HR 85 | Temp 98.2°F | Resp 16 | Ht 66.0 in | Wt 156.0 lb

## 2014-08-18 DIAGNOSIS — G8929 Other chronic pain: Secondary | ICD-10-CM

## 2014-08-18 DIAGNOSIS — F17200 Nicotine dependence, unspecified, uncomplicated: Secondary | ICD-10-CM | POA: Diagnosis not present

## 2014-08-18 DIAGNOSIS — L4050 Arthropathic psoriasis, unspecified: Secondary | ICD-10-CM

## 2014-08-18 DIAGNOSIS — M0609 Rheumatoid arthritis without rheumatoid factor, multiple sites: Secondary | ICD-10-CM | POA: Diagnosis not present

## 2014-08-18 DIAGNOSIS — R52 Pain, unspecified: Secondary | ICD-10-CM

## 2014-08-18 DIAGNOSIS — H6123 Impacted cerumen, bilateral: Secondary | ICD-10-CM

## 2014-08-18 DIAGNOSIS — B20 Human immunodeficiency virus [HIV] disease: Secondary | ICD-10-CM

## 2014-08-18 NOTE — Progress Notes (Signed)
Subjective:    Patient ID: Garrett Schroeder, male    DOB: 11/03/1971, 43 y.o.   MRN: [id number]  HPI Patient presents to establish care with a primary provider. He states that he was seeing Dr. Lina Sayreimothy Lane for HIV previously and all other primary needs. Patient was diagnosed with HIV in 2004. He states that he takes medications consistently and [facility].   Patient is complaining of rheumatoid arthritis. He states that RA was diagnosed in [place] at 2009. Patient has dibilitating RA. Ambulates with a cane. Patient complains of 10/10 chronic pain daily. He is complaining of joint pain and immobility on rising, which worsens with activity. He reports that he has not been on therapy for RA since [place]ocating to [place]. He currently takes Ibuprofen 800 mg three times per day as needed for inflammation.   Past Medical History  Diagnosis Date  . Psoriatic arthritis mutilans   . HIV infection     Review of Systems  Constitutional: Negative for fever and fatigue.  HENT: Negative.   Eyes: Negative.   Respiratory: Negative.   Cardiovascular: Negative.   Gastrointestinal: Negative.   Endocrine: Negative.   Genitourinary: Negative.   Musculoskeletal: Positive for joint swelling and myalgias (left shouler, left ankle, right foot). Negative for neck stiffness.  Skin: Negative.   Allergic/Immunologic: Negative.   Neurological: Negative.   Hematological: Negative.   Psychiatric/Behavioral: Negative.        Objective:   Physical Exam  Constitutional: He appears well-developed and well-nourished.  Non-toxic appearance.  HENT:  Head: Normocephalic and atraumatic.  Right Ear: Decreased hearing is noted.  Left Ear: Decreased hearing is noted.  Nose: Nose normal.  Mouth/Throat: Uvula is midline. Dental caries present.  Bilateral cerumen impaction  Eyes: Conjunctivae and EOM are normal. Pupils are equal, round, and reactive to light. Lids are everted and  swept, no foreign bodies found.  Neck: Normal range of motion. Neck supple.  Cardiovascular: Regular rhythm, normal heart sounds, intact distal pulses and normal pulses.   Pulmonary/Chest: Effort normal.  Abdominal: Soft. Normal appearance and bowel sounds are normal.  Musculoskeletal:       Right ankle: He exhibits decreased range of motion, swelling and deformity. He exhibits no ecchymosis and normal pulse. Tenderness.       Right hand: He exhibits decreased range of motion, bony tenderness and deformity. He exhibits no tenderness and no swelling. Normal sensation noted. Decreased strength noted.  Lymphadenopathy:       Head (right side): No submental and no submandibular adenopathy present.       Head (left side): No submental and no submandibular adenopathy present.  Neurological: He is alert. No cranial nerve deficit or sensory deficit. He exhibits normal muscle tone.  Psychiatric: His behavior is normal. Judgment and thought content normal. His mood appears anxious. His speech is not rapid and/or pressured. Cognition and memory are normal. He exhibits a depressed mood.         BP 128/71  Pulse 85  Temp(Src) 98.2 F (36.8 C) (Oral)  Resp 16  Ht 5\' 6"  (1.676 m)  Wt 156 lb (70.761 kg)  BMI 25.19 kg/m2 Assessment & Plan:   1. Human immunodeficiency virus (HIV) disease  Stable on current medication regimen. He reports that he is taking medications consistently.   2. Chronic generalized pain Patient complains of 10/10 generalized pain. He states that he is currently not taking pain medications. He states that he  was previously followed by a pain clinic. Prior to going to pain clinic, he states that he was prescribed Percocet previously by Dr. Maurice MarchLane. He is currently not taking opiate medications.   - Ambulatory referral to Pain Clinic  3. 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 - Arthritis Panel - CBC with  Differential  4. Psoriatic arthritis He states that he [facility] and is awaiting an appointment.   5. 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 He states that it has been years. He reports that he currently smokes marijuana. He states that it helps with RA pain.   6. Tobacco dependence Patient current smokes 1 pack per day. He states that he quit smoking years ago with Chantix, but started back smoking due to increased stressors. He is currently not interested in quitting smoking    RTC: 1 month with Dr. Ashley RoyaltyMatthews for f/u of RA  Massie MaroonHollis,Majestic Brister M, FNP

## 2014-08-18 NOTE — Patient Instructions (Signed)
Chronic Pain Chronic pain can be defined as pain that is off and on and lasts for 3-6 months or longer. Many things cause chronic pain, which can make it difficult to make a diagnosis. There are many treatment options available for chronic pain. However, finding a treatment that works well for you may require trying various approaches until the right one is found. Many people benefit from a combination of two or more types of treatment to control their pain. SYMPTOMS  Chronic pain can occur anywhere in the body and can range from mild to very severe. Some types of chronic pain include:  Headache.  Low back pain.  Cancer pain.  Arthritis pain.  Neurogenic pain. This is pain resulting from damage to nerves. People with chronic pain may also have other symptoms such as:  Depression.  Anger.  Insomnia.  Anxiety. DIAGNOSIS  Your health care provider will help diagnose your condition over time. In many cases, the initial focus will be on excluding possible conditions that could be causing the pain. Depending on your symptoms, your health care provider may order tests to diagnose your condition. Some of these tests may include:   Blood tests.   CT scan.   MRI.   X-rays.   Ultrasounds.   Nerve conduction studies.  You may need to see a specialist.  TREATMENT  Finding treatment that works well may take time. You may be referred to a pain specialist. He or she may prescribe medicine or therapies, such as:   Mindful meditation or yoga.  Shots (injections) of numbing or pain-relieving medicines into the spine or area of pain.  Local electrical stimulation.  Acupuncture.   Massage therapy.   Aroma, color, light, or sound therapy.   Biofeedback.   Working with a physical therapist to keep from getting stiff.   Regular, gentle exercise.   Cognitive or behavioral therapy.   Group support.  Sometimes, surgery may be recommended.  HOME CARE INSTRUCTIONS    Take all medicines as directed by your health care provider.   Lessen stress in your life by relaxing and doing things such as listening to calming music.   Exercise or be active as directed by your health care provider.   Eat a healthy diet and include things such as vegetables, fruits, fish, and lean meats in your diet.   Keep all follow-up appointments with your health care provider.   Attend a support group with others suffering from chronic pain. SEEK MEDICAL CARE IF:   Your pain gets worse.   You develop a new pain that was not there before.   You cannot tolerate medicines given to you by your health care provider.   You have new symptoms since your last visit with your health care provider.  SEEK IMMEDIATE MEDICAL CARE IF:   You feel weak.   You have decreased sensation or numbness.   You lose control of bowel or bladder function.   Your pain suddenly gets much worse.   You develop shaking.  You develop chills.  You develop confusion.  You develop chest pain.  You develop shortness of breath.  MAKE SURE YOU:  Understand these instructions.  Will watch your condition.  Will get help right away if you are not doing well or get worse. Document Released: 07/14/2002 Document Revised: 06/24/2013 Document Reviewed: 04/17/2013 [facility] Patient Information 2015 [place], [place]. This information is not intended to replace advice given to you by your health care provider. Make sure you discuss any  questions you have with your health care provider. Cerumen Impaction A cerumen impaction is when the wax in your ear forms a plug. This plug usually causes reduced hearing. Sometimes it also causes an earache or dizziness. Removing a cerumen impaction can be difficult and painful. The wax sticks to the ear canal. The canal is sensitive and bleeds easily. If you try to remove a heavy wax buildup with a cotton tipped swab, you may push it in further. Irrigation  with water, suction, and small ear curettes may be used to clear out the wax. If the impaction is fixed to the skin in the ear canal, ear drops may be needed for a few days to loosen the wax. People who build up a lot of wax frequently can use ear wax removal products available in your local drugstore. SEEK MEDICAL CARE IF:  You develop an earache, increased hearing loss, or marked dizziness. Document Released: 11/29/2004 Document Revised: 01/14/2012 Document Reviewed: 01/19/2010 [facility] Patient Information 2015 [place]. This information is not intended to replace advice given to you by your health care provider. Make sure you discuss any questions you have with your health care provider. Cerumen Impaction A cerumen impaction is when the wax in your ear forms a plug. This plug usually causes reduced hearing. Sometimes it also causes an earache or dizziness. Removing a cerumen impaction can be difficult and painful. The wax sticks to the ear canal. The canal is sensitive and bleeds easily. If you try to remove a heavy wax buildup with a cotton tipped swab, you may push it in further. Irrigation with water, suction, and small ear curettes may be used to clear out the wax. If the impaction is fixed to the skin in the ear canal, ear drops may be needed for a few days to loosen the wax. People who build up a lot of wax frequently can use ear wax removal products available in your local drugstore. SEEK MEDICAL CARE IF:  You develop an earache, increased hearing loss, or marked dizziness. Document Released: 11/29/2004 Document Revised: 01/14/2012 Document Reviewed: 01/19/2010 [facility] Patient Information 2015 [place]. This information is not intended to replace advice given to you by your health care provider. Make sure you discuss any questions you have with your health care provider.

## 2014-08-19 LAB — CBC WITH DIFFERENTIAL/PLATELET
BASOS PCT: 0 % (ref 0–1)
Basophils Absolute: 0 10*3/uL (ref 0.0–0.1)
EOS ABS: 0 10*3/uL (ref 0.0–0.7)
Eosinophils Relative: 0 % (ref 0–5)
HEMATOCRIT: 43 % (ref 39.0–52.0)
HEMOGLOBIN: 15.1 g/dL (ref 13.0–17.0)
LYMPHS ABS: 1 10*3/uL (ref 0.7–4.0)
Lymphocytes Relative: 11 % — ABNORMAL LOW (ref 12–46)
MCH: 31.6 pg (ref 26.0–34.0)
MCHC: 35.1 g/dL (ref 30.0–36.0)
MCV: 90 fL (ref 78.0–100.0)
MONO ABS: 0.9 10*3/uL (ref 0.1–1.0)
MONOS PCT: 9 % (ref 3–12)
Neutro Abs: 7.6 10*3/uL (ref 1.7–7.7)
Neutrophils Relative %: 80 % — ABNORMAL HIGH (ref 43–77)
Platelets: 315 10*3/uL (ref 150–400)
RBC: 4.78 MIL/uL (ref 4.22–5.81)
RDW: 13.9 % (ref 11.5–15.5)
WBC: 9.5 10*3/uL (ref 4.0–10.5)

## 2014-08-19 LAB — ARTHRITIS PANEL
Anti Nuclear Antibody(ANA): NEGATIVE
Rhuematoid fact SerPl-aCnc: <10 IU/mL (ref <=14)
Sed Rate: 23 mm/hr — ABNORMAL HIGH (ref 0–16)
Uric Acid, Serum: 4.9 mg/dL (ref 4.0–7.8)

## 2014-08-22 ENCOUNTER — Encounter: Payer: Self-pay | Admitting: Family Medicine

## 2014-08-25 ENCOUNTER — Telehealth: Payer: Self-pay

## 2014-08-25 NOTE — Telephone Encounter (Signed)
[facility] patient has appointment scheduled 09/03/2014 @10am .

## 2014-09-03 ENCOUNTER — Telehealth: Payer: Self-pay | Admitting: Internal Medicine

## 2014-09-03 NOTE — Telephone Encounter (Signed)
Contacted patient regarding change in appointment date. New appointment date -09/13/14 4:30pm

## 2014-09-06 ENCOUNTER — Ambulatory Visit: Payer: Medicaid Other | Admitting: Internal Medicine

## 2014-09-13 ENCOUNTER — Ambulatory Visit (INDEPENDENT_AMBULATORY_CARE_PROVIDER_SITE_OTHER): Payer: Medicaid Other | Admitting: Internal Medicine

## 2014-09-13 VITALS — BP 108/72 | HR 80 | Temp 98.2°F | Resp 14 | Ht 66.0 in | Wt 154.0 lb

## 2014-09-13 DIAGNOSIS — G894 Chronic pain syndrome: Secondary | ICD-10-CM

## 2014-09-16 NOTE — Progress Notes (Signed)
Patient ID: Garrett Schroeder, male   DOB: 1971-07-11, 43 y.o.   MRN: [id number]   Garrett Schroeder, is a 42 y.o. male  FYB:[id number]  NID:[id number]  DOB - 1971/06/03  CC:  Chief Complaint  Patient presents with  . Follow-up       HPI: Garrett Schroeder is a 43 y.o. male here today for a follow up visit to his claims of Psoriatic and Rheumatoid arthritis. Pt claims that he was informed by this office that he did not have RA but rather Psoriatic Arthritis. Which is contrary to the reports and documentation of the NP who saw the patient on his 1st visit. Per Ms. Hollis, the patient on his last visit reported that  Patient has No headache, No chest pain, No abdominal pain - No Nausea, No new weakness tingling or numbness, No Cough - SOB.  Allergies  Allergen Reactions  . Codeine    Past Medical History  Diagnosis Date  . Psoriatic arthritis mutilans   . HIV infection    Current Outpatient Prescriptions on File Prior to Visit  Medication Sig Dispense Refill  . Coal Tar Extract 10 % SHAM Ok to use 3% if 2% not available.Use 2-3 times week.    . Darunavir Ethanolate (PREZISTA) 800 MG tablet Take 1 tablet (800 mg total) by mouth daily. 30 tablet 5  . dolutegravir (TIVICAY) 50 MG tablet Take 1 tablet (50 mg total) by mouth daily. 30 tablet 5  . emtricitabine-tenofovir (TRUVADA) 200-300 MG per tablet Take 1 tablet by mouth daily. 30 tablet 5  . fluocinonide ointment (LIDEX) 1.44 % Apply 1 application topically at bedtime. And cover 60 g 2  . ibuprofen (ADVIL,MOTRIN) 800 MG tablet Take 1 tablet (800 mg total) by mouth every 8 (eight) hours as needed. 90 tablet 2  . ritonavir (NORVIR) 100 MG TABS tablet Take 1 tablet (100 mg total) by mouth daily. 30 tablet 5  . varenicline (CHANTIX PAK) 0.5 MG X 11 & 1 MG X 42 tablet 0.5 mg tablet once daily for 3 days, then 0.5 mg tablet twice daily for 4 days, then increase to one 1 mg tablet twice daily. 53 tablet 0  . varenicline (CHANTIX) 0.5 MG tablet Take 1  tablet (0.5 mg total) by mouth 2 (two) times daily. 60 tablet 1   No current facility-administered medications on file prior to visit.   Family History  Problem Relation Age of Onset  . Arthritis Neg Hx    History   Social History  . Marital Status: Single    Spouse Name: N/A    Number of Children: N/A  . Years of Education: N/A   Occupational History  . Not on file.   Social History Main Topics  . Smoking status: Current Every Day Smoker -- 0.50 packs/day for 30 years    Types: Cigarettes  . Smokeless tobacco: Never Used     Comment: interested in starting Chantix  . Alcohol Use: 2.5 oz/week    5 drink(s) per week  . Drug Use: 20.00 per week    Special: Marijuana  . Sexual Activity:    Partners: Female     Comment: declined condoms   Other Topics Concern  . Not on file   Social History Narrative  . No narrative on file    Review of Systems: Constitutional: Negative for fever, chills, diaphoresis, activity change, appetite change and fatigue. HENT: Negative for ear pain, nosebleeds, congestion, facial swelling, rhinorrhea, neck pain, neck stiffness and ear discharge.  Eyes: Negative for pain, discharge, redness, itching and visual disturbance. Respiratory: Negative for cough, choking, chest tightness, shortness of breath, wheezing and stridor.  Cardiovascular: Negative for chest pain, palpitations and leg swelling. Gastrointestinal: Negative for abdominal distention. Genitourinary: Negative for dysuria, urgency, frequency, hematuria, flank pain, decreased urine volume, difficulty urinating and dyspareunia.  Musculoskeletal: Negative morning stiffness, or joint pain that gets better as the day progresses.  Neurological: Negative for dizziness, tremors, seizures, syncope, facial asymmetry, speech difficulty, weakness, light-headedness, numbness and headaches.  Hematological: Negative for adenopathy. Does not bruise/bleed easily. Psychiatric/Behavioral: Negative for  hallucinations, behavioral problems, confusion, dysphoric mood, decreased concentration and agitation.  Skin: He reports a history of rash in the groin area   Objective:    Filed Vitals:   09/13/14 1629  BP: 108/72  Pulse: 80  Temp: 98.2 F (36.8 C)  Resp: 14    Physical Exam: Constitutional: Patient appears well-developed and well-nourished. No distress. HENT: Normocephalic, atraumatic, External right and left ear normal. Oropharynx is clear and moist.  Eyes: Conjunctivae and EOM are normal. PERRLA, no scleral icterus. Musculoskeletal: The patient refused an examination. However he has no observed nodules, inflamed joints or effusions noted Lymphadenopathy:refused palpation Neuro: Alert. Normal reflexes, muscle tone coordination. No cranial nerve deficit. Skin:  No rash noted. Not diaphoretic. No erythema. No pallor. He exposed the groin area but there was no rash noted.  Psychiatric: Normal mood and affect. Behavior, judgment, thought content erratic  Lab Results  Component Value Date   WBC 9.5 08/18/2014   HGB 15.1 08/18/2014   HCT 43.0 08/18/2014   MCV 90.0 08/18/2014   PLT 315 08/18/2014   Lab Results  Component Value Date   CREATININE 1.25 07/13/2014   BUN 26* 07/13/2014   NA 139 07/13/2014   K 4.1 07/13/2014   CL 103 07/13/2014   CO2 27 07/13/2014    Lab Results  Component Value Date   HGBA1C  02/14/2008    5.6 (NOTE)   The ADA recommends the following therapeutic goals for glycemic   control related to Hgb A1C measurement:   Goal of Therapy:   < 7.0% Hgb A1C   Action Suggested:  > 8.0% Hgb A1C   Ref:  Diabetes Care, 22, Suppl. 1, 1999   Lipid Panel     Component Value Date/Time   CHOL 214* 11/21/2011 0946   TRIG 363* 11/21/2011 0946   HDL 49 11/21/2011 0946   CHOLHDL 4.4 11/21/2011 0946   VLDL 73* 11/21/2011 0946   LDLCALC 92 11/21/2011 0946       Assessment and plan:   1. Arthritis - The patient is refusing examination and does not wan tot  discuss the possibilities of his diagnosis. He maintains that he was told by someone in this office that he had Psoriatic Arthritis rather than RA. I have reviewed the labs available and he has a mildly elevated ESR which is non-specific particularly in the setting of a chronic infection. His RF is negative as is the ANA. He would benefit from CCP and it would be helpful to have a detailed examination of his skin as well as Radiologic evaluation of any inflamed joints. In the absence of these elements, it is impossible to make a diagnosis of Psoriatic Arthritis.    Follow-up as needed.  The patient was given clear instructions to go to ER or return to medical center if symptoms don't improve, worsen or new problems develop. The patient verbalized understanding. The patient was told to call  to get lab results if they haven't heard anything in the next week.     This note has been created with Surveyor, quantity. Any transcriptional errors are unintentional.    Allianna Beaubien A., MD [place], [place]   09/16/2014, 9:00 AM

## 2014-09-18 ENCOUNTER — Other Ambulatory Visit: Payer: Self-pay | Admitting: Internal Medicine

## 2014-09-18 DIAGNOSIS — B20 Human immunodeficiency virus [HIV] disease: Secondary | ICD-10-CM

## 2014-09-21 ENCOUNTER — Telehealth: Payer: Self-pay | Admitting: Licensed Clinical Social Worker

## 2014-09-21 NOTE — Telephone Encounter (Signed)
He needs to talk to his PCP.  I only manage his HIV.  thanks

## 2014-09-21 NOTE — Telephone Encounter (Signed)
Patient is [place] to [place] to see his girlfriend, he wanted to know if he could get Viagra called in.

## 2014-09-27 ENCOUNTER — Telehealth: Payer: Self-pay

## 2014-09-27 NOTE — Telephone Encounter (Signed)
Patient states he is having problems with an erection while using a condom and not able to perform intercourse. He is upset and wonders what he can do since Dr Comer refused Luciana Axehis request for Viagra.  I suggested he use a larger size condom since this only happens with condoms it could be restricting blood flow to his penis. Patient advised if problem continues to seek evaluation with a primary care physician.  He had several questions regarding male condoms and I told him this would be a great option to try.  They can be found in most pharmacies.   Laurell Josephsammy K Tylea Hise, RN

## 2014-10-12 ENCOUNTER — Other Ambulatory Visit: Payer: Self-pay | Admitting: Internal Medicine

## 2014-10-12 DIAGNOSIS — B20 Human immunodeficiency virus [HIV] disease: Secondary | ICD-10-CM

## 2014-11-08 ENCOUNTER — Other Ambulatory Visit: Payer: Self-pay | Admitting: Internal Medicine

## 2014-11-15 ENCOUNTER — Other Ambulatory Visit: Payer: Medicaid Other

## 2014-11-15 DIAGNOSIS — Z113 Encounter for screening for infections with a predominantly sexual mode of transmission: Secondary | ICD-10-CM

## 2014-11-15 DIAGNOSIS — Z79899 Other long term (current) drug therapy: Secondary | ICD-10-CM

## 2014-11-15 DIAGNOSIS — B20 Human immunodeficiency virus [HIV] disease: Secondary | ICD-10-CM

## 2014-11-15 LAB — LIPID PANEL
CHOLESTEROL: 226 mg/dL — AB (ref 0–200)
HDL: 40 mg/dL (ref >39)
LDL CALC: 159 mg/dL — AB (ref 0–99)
TRIGLYCERIDES: 134 mg/dL (ref <150)
Total CHOL/HDL Ratio: 5.7 Ratio
VLDL: 27 mg/dL (ref 0–40)

## 2014-11-15 LAB — COMPLETE METABOLIC PANEL WITH GFR
ALT: 13 U/L (ref 0–53)
AST: 15 U/L (ref 0–37)
Albumin: 4.1 g/dL (ref 3.5–5.2)
Alkaline Phosphatase: 58 U/L (ref 39–117)
BUN: 25 mg/dL — ABNORMAL HIGH (ref 6–23)
CO2: 28 meq/L (ref 19–32)
Calcium: 9.5 mg/dL (ref 8.4–10.5)
Chloride: 104 mEq/L (ref 96–112)
Creat: 1.18 mg/dL (ref 0.50–1.35)
GFR, Est African American: 87 mL/min
GFR, Est Non African American: 76 mL/min
Glucose, Bld: 105 mg/dL — ABNORMAL HIGH (ref 70–99)
POTASSIUM: 4.4 meq/L (ref 3.5–5.3)
Sodium: 137 mEq/L (ref 135–145)
TOTAL PROTEIN: 7.1 g/dL (ref 6.0–8.3)
Total Bilirubin: 0.5 mg/dL (ref 0.2–1.2)

## 2014-11-16 LAB — CBC WITH DIFFERENTIAL/PLATELET
Basophils Absolute: 0 10*3/uL (ref 0.0–0.1)
Basophils Relative: 0 % (ref 0–1)
Eosinophils Absolute: 0.1 10*3/uL (ref 0.0–0.7)
Eosinophils Relative: 2 % (ref 0–5)
HCT: 44.4 % (ref 39.0–52.0)
HEMOGLOBIN: 15.6 g/dL (ref 13.0–17.0)
LYMPHS ABS: 1.3 10*3/uL (ref 0.7–4.0)
LYMPHS PCT: 18 % (ref 12–46)
MCH: 32.4 pg (ref 26.0–34.0)
MCHC: 35.1 g/dL (ref 30.0–36.0)
MCV: 92.1 fL (ref 78.0–100.0)
MONO ABS: 0.8 10*3/uL (ref 0.1–1.0)
MONOS PCT: 11 % (ref 3–12)
MPV: 8.8 fL (ref 8.6–12.4)
NEUTROS ABS: 4.8 10*3/uL (ref 1.7–7.7)
Neutrophils Relative %: 69 % (ref 43–77)
Platelets: 266 10*3/uL (ref 150–400)
RBC: 4.82 MIL/uL (ref 4.22–5.81)
RDW: 13.8 % (ref 11.5–15.5)
WBC: 7 10*3/uL (ref 4.0–10.5)

## 2014-11-16 LAB — RPR

## 2014-11-16 LAB — HIV-1 RNA QUANT-NO REFLEX-BLD: HIV-1 RNA Quant, Log: <1.3 {Log} (ref <1.30)

## 2014-11-16 LAB — T-HELPER CELL (CD4) - (RCID CLINIC ONLY)
CD4 % Helper T Cell: 24 % — ABNORMAL LOW (ref 33–55)
CD4 T Cell Abs: 300 /uL — ABNORMAL LOW (ref 400–2700)

## 2014-11-22 LAB — HLA B*5701: HLA-B 5701 W/RFLX HLA-B HIGH: NEGATIVE

## 2014-11-30 ENCOUNTER — Encounter: Payer: Self-pay | Admitting: Internal Medicine

## 2014-11-30 ENCOUNTER — Ambulatory Visit (INDEPENDENT_AMBULATORY_CARE_PROVIDER_SITE_OTHER): Payer: Medicaid Other | Admitting: Internal Medicine

## 2014-11-30 VITALS — BP 130/77 | HR 76 | Temp 98.2°F | Wt 157.0 lb

## 2014-11-30 DIAGNOSIS — B20 Human immunodeficiency virus [HIV] disease: Secondary | ICD-10-CM

## 2014-11-30 NOTE — Assessment & Plan Note (Signed)
He is doing well with his regimen now once a day. No issues with the Tivicay. Seems to have good compliance now and will return in 4 months with labs before.

## 2014-11-30 NOTE — Progress Notes (Signed)
  Subjective:    Patient ID: Garrett Schroeder, male    DOB: 12/28/1970, 44 y.o.   MRN: [id number]  HPI He comes in for follow up of HIV.  He continues on Tivicay, Truvada, Prezista and Norvir. He denies any missed doses. His HIV virus is now undetectable and his CD4 count is 300. He has no new complaints. He continues to complain of chronic pain. He also is asking for male condoms. He had recently went to [place] thinking of moving however continues to stay here in [place] to take care of his ill parents.   Review of Systems  Constitutional: Negative for fever, fatigue and unexpected weight change.  HENT: Negative for mouth sores and sore throat.   Eyes: Negative for photophobia and visual disturbance.  Cardiovascular: Negative for chest pain.  Gastrointestinal: Negative for diarrhea and constipation.  Genitourinary: Negative for discharge.  Musculoskeletal: Positive for myalgias, back pain, joint swelling and arthralgias.  Skin: Negative for rash.  Neurological: Negative for dizziness, light-headedness and headaches.  Hematological: Negative for adenopathy.  Psychiatric/Behavioral: Negative for dysphoric mood.       Objective:   Physical Exam  Constitutional: He appears well-developed and well-nourished. No distress.  HENT:  Mouth/Throat: No oropharyngeal exudate.  Eyes: No scleral icterus.  Cardiovascular: Normal rate, regular rhythm and normal heart sounds.   No murmur heard. Pulmonary/Chest: Effort normal and breath sounds normal. No respiratory distress. He has no wheezes.  Lymphadenopathy:    He has no cervical adenopathy.  Skin: No rash noted.          Assessment & Plan:

## 2014-12-10 ENCOUNTER — Other Ambulatory Visit: Payer: Self-pay | Admitting: Internal Medicine

## 2014-12-21 ENCOUNTER — Other Ambulatory Visit: Payer: Self-pay | Admitting: *Deleted

## 2014-12-21 DIAGNOSIS — R21 Rash and other nonspecific skin eruption: Secondary | ICD-10-CM

## 2014-12-21 MED ORDER — FLUOCINONIDE 0.05 % EX OINT: 1.0000 "application " | TOPICAL_OINTMENT | Freq: Every day | CUTANEOUS | Status: DC

## 2015-01-25 ENCOUNTER — Telehealth: Payer: Self-pay | Admitting: *Deleted

## 2015-01-25 NOTE — Telephone Encounter (Signed)
Requesting Lidex Ointment be switched to a cream.  RN checked with the pt's pharmacy about his HIV rxes.  [facility] said that the pt has not filled his rxes since 12/10/14.  Please advise about switching ointment to cream.

## 2015-01-26 ENCOUNTER — Other Ambulatory Visit: Payer: Self-pay | Admitting: Internal Medicine

## 2015-01-26 DIAGNOSIS — R21 Rash and other nonspecific skin eruption: Secondary | ICD-10-CM

## 2015-01-26 MED ORDER — FLUOCINONIDE 0.05 % EX CREA: 1.0000 "application " | TOPICAL_CREAM | Freq: Every day | CUTANEOUS | Status: DC

## 2015-01-26 NOTE — Telephone Encounter (Signed)
sent 

## 2015-02-02 ENCOUNTER — Other Ambulatory Visit: Payer: Self-pay | Admitting: *Deleted

## 2015-02-02 MED ORDER — RITONAVIR 100 MG PO TABS: 100.0000 mg | ORAL_TABLET | Freq: Every day | ORAL | Status: DC

## 2015-03-24 ENCOUNTER — Other Ambulatory Visit: Payer: Medicaid Other

## 2015-03-28 ENCOUNTER — Other Ambulatory Visit: Payer: Medicaid Other

## 2015-03-28 DIAGNOSIS — B20 Human immunodeficiency virus [HIV] disease: Secondary | ICD-10-CM

## 2015-03-29 LAB — T-HELPER CELL (CD4) - (RCID CLINIC ONLY)
CD4 % Helper T Cell: 30 % — ABNORMAL LOW (ref 33–55)
CD4 T CELL ABS: 330 /uL — AB (ref 400–2700)

## 2015-03-29 LAB — HIV-1 RNA QUANT-NO REFLEX-BLD
HIV 1 RNA Quant: 52 copies/mL — ABNORMAL HIGH (ref <20)
HIV-1 RNA Quant, Log: 1.72 {Log} — ABNORMAL HIGH (ref <1.30)

## 2015-04-07 ENCOUNTER — Ambulatory Visit: Payer: Medicaid Other | Admitting: Internal Medicine

## 2015-05-31 ENCOUNTER — Other Ambulatory Visit: Payer: Self-pay | Admitting: Licensed Clinical Social Worker

## 2015-05-31 ENCOUNTER — Telehealth: Payer: Self-pay | Admitting: Licensed Clinical Social Worker

## 2015-05-31 MED ORDER — COAL TAR EXTRACT 10 % EX SHAM: 1.0000 "application " | MEDICATED_SHAMPOO | CUTANEOUS | Status: DC | PRN

## 2015-05-31 NOTE — Telephone Encounter (Signed)
Patient called stating that he just returned from [facility] and his dermatitis is out of control on his scalp and his skin is swollen and red. He wanted an appointment for tomorrow, appt. Made with Dr. Drue Second. He also wanted the medicated shampoo called in. I will call that in as well.

## 2015-06-01 ENCOUNTER — Ambulatory Visit (INDEPENDENT_AMBULATORY_CARE_PROVIDER_SITE_OTHER): Payer: Medicaid Other | Admitting: Internal Medicine

## 2015-06-01 ENCOUNTER — Encounter: Payer: Self-pay | Admitting: Internal Medicine

## 2015-06-01 ENCOUNTER — Telehealth: Payer: Self-pay | Admitting: Licensed Clinical Social Worker

## 2015-06-01 VITALS — BP 121/79 | HR 86 | Temp 98.8°F

## 2015-06-01 DIAGNOSIS — L409 Psoriasis, unspecified: Secondary | ICD-10-CM | POA: Diagnosis present

## 2015-06-01 DIAGNOSIS — M25572 Pain in left ankle and joints of left foot: Secondary | ICD-10-CM

## 2015-06-01 MED ORDER — TRIAMCINOLONE ACETONIDE 0.5 % EX OINT: 1.0000 "application " | TOPICAL_OINTMENT | Freq: Two times a day (BID) | CUTANEOUS | Status: DC

## 2015-06-01 NOTE — Telephone Encounter (Signed)
Coal Tar shampoo only in 2.5% strength, OTC

## 2015-06-01 NOTE — Progress Notes (Signed)
  Rfv: sick visit, rash to leg Subjective:    Patient ID: Garrett Schroeder, male    DOB: 1971/06/17, 44 y.o.   MRN: [id number]  HPI 44yo M with HIV disease, psoriatic arthritis, CD 4 count of 460/VL 26 on tivicay/truvada/DRVr. He reports having  rash, pain, and swelling of the L foot that first began approximately 2 weeks ago. Pt reports that his rash covers most of the entire L foot. He reports that the rash is very painful. Pt states that he also has a similar rash covering his entire scalp c/w his psoriasis. He has not used any medication for it. He did go to the ED roughly 3 days ago at [facility] for eval,  left AMA. He comes in for sick visit for evaluation wants pain meds.  Allergies  Allergen Reactions  . Codeine    Current Outpatient Prescriptions on File Prior to Visit  Medication Sig Dispense Refill  . Coal Tar Extract 10 % SHAM Apply 1 application topically as needed. Ok to use 3% if 2% not available.Use 2-3 times week. 1 Bottle 1   No current facility-administered medications on file prior to visit.   Active Ambulatory Problems    Diagnosis Date Noted  . Human immunodeficiency virus (HIV) disease 03/18/2008  . Hematuria 01/25/2011  . Smoker 11/15/2011  . Psoriatic arthritis 05/19/2013  . Excess ear wax 04/20/2014   Resolved Ambulatory Problems    Diagnosis Date Noted  . Chest pain 04/20/2014   Past Medical History  Diagnosis Date  . Psoriatic arthritis mutilans   . HIV infection     Review of Systems Leg pain ,and psoriatic rash. 10 point ros otherwise negative    Objective:   Physical Exam BP 121/79 mmHg  Pulse 86  Temp(Src) 98.8 F (37.1 C) (Oral) gen = a xo by 3 slightly anxious Skin = left leg has ulcerative lesions, scattered islands of erythema to lower shin, and dorsum of foot, appears c/w psoriasis. Ext= trace-1+ edema  Labs: Lab Results  Component Value Date   CD4TCELL 26* 06/14/2015   CD4TABS 460 06/14/2015   Lab Results  Component  Value Date   HIV1RNAQUANT 27* 06/14/2015   Lab Results  Component Value Date   CREATININE 1.18 11/15/2014      Assessment & Plan:  Psoriatic rash = recommend to use kenalog cream and coal tar 1% shampoo to the area. Return in 1 wk to see how it improves  Left leg pain = recommend to use prn advil, no more than  Q8hr. Will not dispense opiates at this time.  hiv disease = well controlled, continue on current regimen

## 2015-06-09 ENCOUNTER — Telehealth: Payer: Self-pay | Admitting: Licensed Clinical Social Worker

## 2015-06-09 NOTE — Telephone Encounter (Signed)
Patient wants a referral back to pain [facility] for psoriatic arthritis.

## 2015-06-10 NOTE — Telephone Encounter (Signed)
Patient called to check on his referral to Pain management. Advised him that his referral is in the works and that once they have an appt someone will call him. He was very anxious and asked when he will know something and I advised him not sure how fast their office works and sometimes it takes 1-2 weeks or longer to get an appt. He advised he can not wait that long and he has insurance and want to go somewhere else. Advised him he has Medicaid and his choices may be limited and he may just need to wait on HEAG. He advised he is in a lot of pain and wants to go somewhere else. Advised him will let the doctor know and see what we can do if anything.

## 2015-06-10 NOTE — Telephone Encounter (Signed)
Ok with me 

## 2015-06-14 ENCOUNTER — Encounter: Payer: Self-pay | Admitting: Internal Medicine

## 2015-06-14 ENCOUNTER — Ambulatory Visit (INDEPENDENT_AMBULATORY_CARE_PROVIDER_SITE_OTHER): Payer: Medicaid Other | Admitting: Internal Medicine

## 2015-06-14 VITALS — BP 125/84 | HR 90 | Temp 98.2°F | Wt 144.0 lb

## 2015-06-14 DIAGNOSIS — L409 Psoriasis, unspecified: Secondary | ICD-10-CM | POA: Diagnosis not present

## 2015-06-14 DIAGNOSIS — B20 Human immunodeficiency virus [HIV] disease: Secondary | ICD-10-CM | POA: Diagnosis not present

## 2015-06-14 DIAGNOSIS — L4050 Arthropathic psoriasis, unspecified: Secondary | ICD-10-CM

## 2015-06-14 MED ORDER — IBUPROFEN 800 MG PO TABS: 800.0000 mg | ORAL_TABLET | Freq: Three times a day (TID) | ORAL | Status: DC | PRN

## 2015-06-14 MED ORDER — TRIAMCINOLONE ACETONIDE 0.5 % EX OINT: 1.0000 "application " | TOPICAL_OINTMENT | Freq: Two times a day (BID) | CUTANEOUS | Status: DC

## 2015-06-14 NOTE — Assessment & Plan Note (Signed)
I again offered to refer him to rheumatology and he is willing.  Will make referral. Refilled ibuprofen with one refill until he sees rheumatology.

## 2015-06-14 NOTE — Assessment & Plan Note (Signed)
Not sure compliance is adequate.  Will recheck labs and I counseled him on remaining undetectable to avoid resistance.  RTC 3-4 months.

## 2015-06-14 NOTE — Progress Notes (Signed)
  Subjective:    Patient ID: Garrett Schroeder, male    DOB: [birth year], 44 y.o.   MRN: [id number]  HPI He comes in for follow up of HIV.  He continues on Tivicay, Truvada, Prezista and Norvir. He reports occasional missed doses.  Last CD4 330 and viral load 52.  He continues to complain of chronic pain in feet due to psoriatic arthritis.  He is asking for referral to pain clinic and for ibuprofen.  No weight loss.  Does not see his PCP anymore.     Review of Systems  Constitutional: Negative for fever, fatigue and unexpected weight change.  HENT: Negative for mouth sores and sore throat.   Eyes: Negative for photophobia and visual disturbance.  Cardiovascular: Negative for chest pain.  Gastrointestinal: Negative for diarrhea and constipation.  Genitourinary: Negative for discharge.  Musculoskeletal: Positive for myalgias, back pain, joint swelling and arthralgias.  Skin: Negative for rash.  Neurological: Negative for dizziness, light-headedness and headaches.  Hematological: Negative for adenopathy.  Psychiatric/Behavioral: Negative for dysphoric mood.       Objective:   Physical Exam  Constitutional: He appears well-developed and well-nourished. No distress.  HENT:  Mouth/Throat: No oropharyngeal exudate.  Eyes: No scleral icterus.  Cardiovascular: Normal rate, regular rhythm and normal heart sounds.   No murmur heard. Pulmonary/Chest: Effort normal and breath sounds normal. No respiratory distress. He has no wheezes.  Lymphadenopathy:    He has no cervical adenopathy.  Skin: No rash noted.          Assessment & Plan:

## 2015-06-15 LAB — T-HELPER CELL (CD4) - (RCID CLINIC ONLY)
CD4 % Helper T Cell: 26 % — ABNORMAL LOW (ref 33–55)
CD4 T CELL ABS: 460 /uL (ref 400–2700)

## 2015-06-16 ENCOUNTER — Other Ambulatory Visit: Payer: Self-pay | Admitting: Internal Medicine

## 2015-06-16 DIAGNOSIS — B20 Human immunodeficiency virus [HIV] disease: Secondary | ICD-10-CM

## 2015-06-16 LAB — HIV-1 RNA QUANT-NO REFLEX-BLD
HIV 1 RNA QUANT: 27 {copies}/mL — AB (ref <20)
HIV-1 RNA Quant, Log: 1.43 {Log} — ABNORMAL HIGH (ref <1.30)

## 2015-06-16 MED ORDER — EMTRICITABINE-TENOFOVIR DF 200-300 MG PO TABS: 1.0000 | ORAL_TABLET | Freq: Every day | ORAL | Status: DC

## 2015-06-16 MED ORDER — RITONAVIR 100 MG PO TABS: 100.0000 mg | ORAL_TABLET | Freq: Every day | ORAL | Status: DC

## 2015-06-20 NOTE — Telephone Encounter (Signed)
Patient is scheduled at [facility] pain Management on 9/23 at 8:30.  Patient aware. He is asking also to be referred to [facility] Rheumatology, ok per [name] at last visit.  Will contact them once their office is open [phone]).

## 2015-06-21 NOTE — Telephone Encounter (Signed)
Rheumatology referral placed 11/1 12:45 Dr. Sharmon Revere [address]. Demetrius Charity: [phone] F: [phone], attn Dr. Sharmon Revere (faxed last office visit, labs, demographics) Patient will need to establish care with PCP on his medicaid card (pt states it is [place] access, has [employer] on the card. Card not in system) or switch to plain medicaid.   He verbalized understanding, agreement.  He will call back if he has any trouble. Andree Coss, RN

## 2015-08-01 ENCOUNTER — Telehealth: Payer: Self-pay | Admitting: *Deleted

## 2015-08-01 NOTE — Telephone Encounter (Signed)
Patient called to give an update from the pain clinic stating he was seen there and was told there was something wrong with his back. Asked if he signed a release for this clinic to get a report and he did not. He said he got medical marijuana from [place] and he was thinking of going back there. He also said he was going to have to "take matters into his own hand in order to get help." He stated he was "bedridden" and it was either "that or suicide" Advised patient if he tells me he is suicidal I need to call 911 to have an officer go out to his home. He than stated it was not that he just was in pain. He then said his psoriasis was also extremely bad right now and it is even in his groin area. He said the kenalog ointment given by [name] is not helping. He is requesting fluocinoide lidex 0.05% cream previously prescribed by Dr. Maurice March. He also said that he appreciated Dr. Maurice March because it was a "one stop shop". He said he is unable to go to dermatology because with his HIV what they prescribe for the psoriasis is too strong and messes with his immune system. Please advise if ok to fill the fluocinoide.

## 2015-08-02 ENCOUNTER — Other Ambulatory Visit: Payer: Self-pay | Admitting: *Deleted

## 2015-08-02 DIAGNOSIS — L409 Psoriasis, unspecified: Secondary | ICD-10-CM

## 2015-08-02 MED ORDER — FLUOCINONIDE 0.05 % EX CREA: 1.0000 "application " | TOPICAL_CREAM | Freq: Two times a day (BID) | CUTANEOUS | Status: DC

## 2015-08-02 NOTE — Telephone Encounter (Signed)
Patient notified and Rx sent to [facility] in [place]. Patient talked to [name] about Rheumatology in the past and is not interested. Garrett Schroeder

## 2015-08-02 NOTE — Telephone Encounter (Signed)
Patient called and advised all of his fingernails and toenails have fallen off and he is in pain and wants something for pain and inflammation. Advised the patient we sent him to pain clinic for this and the doctor is not going to give him Rx for pain meds. He advised he understands but we don't know how bad his pain is. He then asked if I could ask for some Ibuprofen 800 mg he used to give him but more that 30 tabs. Advised him not sure but will ask. Offered the patient a visit and was told he is bedridden and can not come in. Advised him will ask the doctor and give him a call back.

## 2015-08-02 NOTE — Telephone Encounter (Signed)
Ok to fill fluocinoide.  I have other patients with HIV on medications that 'mess with the immune system' no problem and he should at least talk to a rheumatologist for psoriatic arthritis and see if that is an option.

## 2015-08-18 ENCOUNTER — Ambulatory Visit (INDEPENDENT_AMBULATORY_CARE_PROVIDER_SITE_OTHER): Payer: Medicaid Other | Admitting: Internal Medicine

## 2015-08-18 ENCOUNTER — Other Ambulatory Visit: Payer: Self-pay | Admitting: Internal Medicine

## 2015-08-18 ENCOUNTER — Encounter: Payer: Self-pay | Admitting: Internal Medicine

## 2015-08-18 VITALS — BP 113/73 | HR 83 | Temp 98.1°F | Ht 66.0 in | Wt 146.0 lb

## 2015-08-18 DIAGNOSIS — F17200 Nicotine dependence, unspecified, uncomplicated: Secondary | ICD-10-CM

## 2015-08-18 DIAGNOSIS — L4050 Arthropathic psoriasis, unspecified: Secondary | ICD-10-CM

## 2015-08-18 DIAGNOSIS — Z23 Encounter for immunization: Secondary | ICD-10-CM

## 2015-08-18 DIAGNOSIS — Z720 Tobacco use: Secondary | ICD-10-CM

## 2015-08-18 DIAGNOSIS — B20 Human immunodeficiency virus [HIV] disease: Secondary | ICD-10-CM

## 2015-08-18 LAB — CBC WITH DIFFERENTIAL/PLATELET
BASOS PCT: 0 % (ref 0–1)
Basophils Absolute: 0 10*3/uL (ref 0.0–0.1)
EOS PCT: 1 % (ref 0–5)
Eosinophils Absolute: 0.1 10*3/uL (ref 0.0–0.7)
HCT: 41.7 % (ref 39.0–52.0)
Hemoglobin: 14.1 g/dL (ref 13.0–17.0)
Lymphocytes Relative: 17 % (ref 12–46)
Lymphs Abs: 1.4 10*3/uL (ref 0.7–4.0)
MCH: 30.8 pg (ref 26.0–34.0)
MCHC: 33.8 g/dL (ref 30.0–36.0)
MCV: 91 fL (ref 78.0–100.0)
MONOS PCT: 10 % (ref 3–12)
MPV: 8.5 fL — AB (ref 8.6–12.4)
Monocytes Absolute: 0.8 10*3/uL (ref 0.1–1.0)
NEUTROS PCT: 72 % (ref 43–77)
Neutro Abs: 5.8 10*3/uL (ref 1.7–7.7)
PLATELETS: 349 10*3/uL (ref 150–400)
RBC: 4.58 MIL/uL (ref 4.22–5.81)
RDW: 14.6 % (ref 11.5–15.5)
WBC: 8.1 10*3/uL (ref 4.0–10.5)

## 2015-08-18 LAB — COMPLETE METABOLIC PANEL WITH GFR
ALBUMIN: 4.1 g/dL (ref 3.6–5.1)
ALK PHOS: 79 U/L (ref 40–115)
ALT: 12 U/L (ref 9–46)
AST: 14 U/L (ref 10–40)
BILIRUBIN TOTAL: 0.3 mg/dL (ref 0.2–1.2)
BUN: 16 mg/dL (ref 7–25)
CO2: 24 mmol/L (ref 20–31)
CREATININE: 1.01 mg/dL (ref 0.60–1.35)
Calcium: 9.6 mg/dL (ref 8.6–10.3)
Chloride: 104 mmol/L (ref 98–110)
GFR, Est Non African American: >89 mL/min (ref >=60)
Glucose, Bld: 107 mg/dL — ABNORMAL HIGH (ref 65–99)
Potassium: 3.9 mmol/L (ref 3.5–5.3)
Sodium: 138 mmol/L (ref 135–146)
TOTAL PROTEIN: 7.6 g/dL (ref 6.1–8.1)

## 2015-08-18 MED ORDER — PREDNISONE 20 MG PO TABS: 20.0000 mg | ORAL_TABLET | Freq: Every day | ORAL | Status: DC

## 2015-08-18 NOTE — Assessment & Plan Note (Signed)
I advised against taking excessive ibuprofen and not to take while on steroids.  Given 5 days prednisone 60 mg.   I advised again he should consider establishing with rheum and consider biologics to help with symptoms but he continues to stress that "I can't because of HIV" and I again told him I have other patients doing well with HIV and on biologics.

## 2015-08-18 NOTE — Assessment & Plan Note (Signed)
Encouraged cessation.

## 2015-08-18 NOTE — Assessment & Plan Note (Signed)
Will update labs today and send to new clinic once established.  Signed realease of records.

## 2015-08-18 NOTE — Progress Notes (Signed)
   Subjective:    Patient ID: Garrett Schroeder, male    DOB: 07/27/1971, 44 y.o.   MRN: [id number]  HPI Here for a work in visit.  Has HIV and here with worsening psoriasis.  Did not go to rheumatology (with psoriatic arthritis).  Takes his HIV meds daily and denies missed doses.  Continued pain but denied pain clinic due to marijuana in his system.  Moving  [place] [facility] though not aware of any clinic.  Used to go to [facility] in [place]. Asking for steroids. Taking ibuprofen excessively.     Review of Systems  Constitutional: Negative for fever and chills.  Gastrointestinal: Negative for nausea and diarrhea.  Skin: Positive for rash.  Neurological: Negative for dizziness and light-headedness.       Objective:   Physical Exam  Constitutional: He appears well-developed and well-nourished.  Eyes: No scleral icterus.  Cardiovascular: Normal rate, regular rhythm and normal heart sounds.   No murmur heard. Pulmonary/Chest: Effort normal and breath sounds normal. No respiratory distress.  Musculoskeletal:  Left 2nd toe swelling  Skin:  + psoriasis lesions on both elbows, diffuse papillar, 1-2 mm rash on back          Assessment & Plan:

## 2015-08-19 LAB — T-HELPER CELL (CD4) - (RCID CLINIC ONLY)
CD4 % Helper T Cell: 32 % — ABNORMAL LOW (ref 33–55)
CD4 T Cell Abs: 480 /uL (ref 400–2700)

## 2015-08-19 LAB — HIV-1 RNA QUANT-NO REFLEX-BLD
HIV 1 RNA Quant: <20 copies/mL (ref <20)
HIV-1 RNA Quant, Log: <1.3 {Log} (ref <1.30)

## 2015-09-13 ENCOUNTER — Telehealth: Payer: Self-pay | Admitting: *Deleted

## 2015-09-13 NOTE — Telephone Encounter (Signed)
Patient decided to stay in [place]. He is c/o dermatitis and bad case of psoriasis. He is requesting a referral to dermatology and also an Rx for prednisone while he is waiting on the referral. Please advise

## 2015-09-14 NOTE — Telephone Encounter (Signed)
He's requesting dermatology; is that ok? [facility] [place] will see Medicaid patients. Wendall MolaJacqueline Nassim Cosma

## 2015-09-14 NOTE — Telephone Encounter (Signed)
Referral sent to [facility].  Garrett MolaJacqueline Sahithi Schroeder

## 2015-09-14 NOTE — Telephone Encounter (Signed)
Ok, no problem!

## 2015-09-14 NOTE — Telephone Encounter (Signed)
Ok can refill previous prednisone.  He will need a PCP for further management of that.  He has psoriatic arthritis by his report which is why it hurts so rheumatology would be more appropriate.  Ok to refer.

## 2015-09-15 ENCOUNTER — Other Ambulatory Visit: Payer: Self-pay | Admitting: *Deleted

## 2015-09-15 MED ORDER — PREDNISONE 20 MG PO TABS: 20.0000 mg | ORAL_TABLET | Freq: Every day | ORAL | Status: DC

## 2015-09-20 ENCOUNTER — Other Ambulatory Visit: Payer: Self-pay | Admitting: Internal Medicine

## 2015-09-20 NOTE — Telephone Encounter (Signed)
Patient called and asked if we had heard anything about his Derm referral. Advised the patient was sent on 09/14/15 and we are just waiting on word back from the office as to a date. The patient was not satisfied with that and advised he will call the clinic himself to see if he can get info sooner rather than wait. Advised will let Annice PihJackie know.

## 2015-09-21 NOTE — Telephone Encounter (Signed)
Called Garrett Schroeder and appt is 10/25/15 at 9:30 AM. Patient aware Garrett Schroeder

## 2015-09-22 ENCOUNTER — Other Ambulatory Visit: Payer: Self-pay | Admitting: *Deleted

## 2015-09-22 MED ORDER — IBUPROFEN 800 MG PO TABS: 800.0000 mg | ORAL_TABLET | Freq: Three times a day (TID) | ORAL | Status: DC | PRN

## 2015-10-06 ENCOUNTER — Other Ambulatory Visit: Payer: Medicaid Other

## 2015-10-20 ENCOUNTER — Ambulatory Visit: Payer: Medicaid Other | Admitting: Internal Medicine

## 2015-12-28 ENCOUNTER — Other Ambulatory Visit: Payer: Medicaid Other

## 2016-01-02 ENCOUNTER — Other Ambulatory Visit: Payer: Medicaid Other

## 2016-01-02 ENCOUNTER — Other Ambulatory Visit: Payer: Self-pay | Admitting: *Deleted

## 2016-01-02 DIAGNOSIS — L409 Psoriasis, unspecified: Secondary | ICD-10-CM

## 2016-01-02 MED ORDER — FLUOCINONIDE 0.05 % EX CREA: 1.0000 "application " | TOPICAL_CREAM | Freq: Two times a day (BID) | CUTANEOUS | Status: DC

## 2016-01-10 ENCOUNTER — Ambulatory Visit: Payer: Medicaid Other | Admitting: Internal Medicine

## 2016-01-31 ENCOUNTER — Telehealth: Payer: Self-pay | Admitting: *Deleted

## 2016-01-31 NOTE — Telephone Encounter (Signed)
Patient called requesting a referral to an optometrist for eye glasses. He stated he was able to get them before and called his case worker with Medicaid and was told they do not provide glasses except to children or if you have a "disease or condition". Advised him to my knowledge you have to ask the case worker what days that the [employer] [facility]. Show up very early AM and you can get an exam and if needed, eye glasses. He then asked when his appt was with Dr. Luciana Axeomer and advised him it is 02/16/16 at 9:00 AM. It is noted in the appt that he has been off meds x 7 months.

## 2016-02-16 ENCOUNTER — Encounter: Payer: Self-pay | Admitting: Internal Medicine

## 2016-02-16 ENCOUNTER — Ambulatory Visit (INDEPENDENT_AMBULATORY_CARE_PROVIDER_SITE_OTHER): Payer: Medicaid Other | Admitting: Internal Medicine

## 2016-02-16 VITALS — BP 114/69 | HR 98 | Temp 98.1°F | Ht 64.0 in | Wt 148.0 lb

## 2016-02-16 DIAGNOSIS — Z720 Tobacco use: Secondary | ICD-10-CM

## 2016-02-16 DIAGNOSIS — Z79899 Other long term (current) drug therapy: Secondary | ICD-10-CM | POA: Insufficient documentation

## 2016-02-16 DIAGNOSIS — L409 Psoriasis, unspecified: Secondary | ICD-10-CM | POA: Diagnosis not present

## 2016-02-16 DIAGNOSIS — L4050 Arthropathic psoriasis, unspecified: Secondary | ICD-10-CM | POA: Diagnosis not present

## 2016-02-16 DIAGNOSIS — B20 Human immunodeficiency virus [HIV] disease: Secondary | ICD-10-CM | POA: Diagnosis not present

## 2016-02-16 DIAGNOSIS — Z113 Encounter for screening for infections with a predominantly sexual mode of transmission: Secondary | ICD-10-CM

## 2016-02-16 DIAGNOSIS — F17200 Nicotine dependence, unspecified, uncomplicated: Secondary | ICD-10-CM

## 2016-02-16 LAB — BASIC METABOLIC PANEL
BUN: 17 mg/dL (ref 7–25)
CHLORIDE: 101 mmol/L (ref 98–110)
CO2: 21 mmol/L (ref 20–31)
Calcium: 9.5 mg/dL (ref 8.6–10.3)
Creat: 0.94 mg/dL (ref 0.60–1.35)
Glucose, Bld: 94 mg/dL (ref 65–99)
POTASSIUM: 4.4 mmol/L (ref 3.5–5.3)
SODIUM: 135 mmol/L (ref 135–146)

## 2016-02-16 MED ORDER — RITONAVIR 100 MG PO TABS: 100.0000 mg | ORAL_TABLET | Freq: Every day | ORAL | Status: DC

## 2016-02-16 MED ORDER — EMTRICITABINE-TENOFOVIR DF 200-300 MG PO TABS
1.0000 | ORAL_TABLET | Freq: Every day | ORAL | Status: DC
Start: 2016-02-16 — End: 2017-12-30

## 2016-02-16 MED ORDER — FLUOCINONIDE 0.05 % EX CREA: 1.0000 "application " | TOPICAL_CREAM | Freq: Two times a day (BID) | CUTANEOUS | Status: DC

## 2016-02-16 MED ORDER — DOLUTEGRAVIR SODIUM 50 MG PO TABS: 50.0000 mg | ORAL_TABLET | Freq: Every day | ORAL | Status: DC

## 2016-02-16 MED ORDER — DARUNAVIR ETHANOLATE 800 MG PO TABS: 800.0000 mg | ORAL_TABLET | Freq: Every day | ORAL | Status: DC

## 2016-02-16 MED ORDER — IBUPROFEN 800 MG PO TABS: 800.0000 mg | ORAL_TABLET | Freq: Three times a day (TID) | ORAL | Status: DC | PRN

## 2016-02-16 NOTE — Assessment & Plan Note (Addendum)
Will again refer to rheumatology.   I refilled ibuprofen and counseled on appriopriate use.

## 2016-02-16 NOTE — Progress Notes (Signed)
CC: Follow up for HIV  Interval history: Currently is coming back in care after initially saying he was moving back to [place].  He also has been seen by dermatology for his psoriatic arthritis. I believe he also saw rheumatology but does not think so.  He was placed on MTX by his dermatologist and developed a rash.  He has not been back since he owes the copay (6$).   Since last visit he had stopped his ARVs and was in the hospital for rash  Has no associated n/v or weight loss.   Still with arthritis and worsening rash.  Can't go to pain clinic due to marijuana use.    Prior to Admission medications   Medication Sig Start Date End Date Taking? Authorizing Provider  ibuprofen (ADVIL,MOTRIN) 800 MG tablet Take 1 tablet (800 mg total) by mouth every 8 (eight) hours as needed. 09/22/15  Yes Gardiner Barefootobert W Kellyann Ordway, MD  Coal Tar Extract 10 % SHAM Apply 1 application topically as needed. Ok to use 3% if 2% not available.Use 2-3 times week. Patient not taking: Reported on 02/16/2016 05/31/15   Gardiner Barefootobert W Ayla Dunigan, MD  emtricitabine-tenofovir (TRUVADA) 200-300 MG per tablet Take 1 tablet by mouth daily. Patient not taking: Reported on 02/16/2016 06/16/15   Gardiner Barefootobert W Josiel Gahm, MD  fluocinonide cream (LIDEX) 0.05 % Apply 1 application topically 2 (two) times daily. Patient not taking: Reported on 02/16/2016 01/02/16   Gardiner Barefootobert W Sanaai Doane, MD  predniSONE (DELTASONE) 20 MG tablet Take 1 tablet (20 mg total) by mouth daily with breakfast. Patient not taking: Reported on 02/16/2016 09/15/15   Gardiner Barefootobert W Brock Larmon, MD  PREZISTA 800 MG tablet TAKE 1 TABLET BY MOUTH EVERY DAY Patient not taking: Reported on 02/16/2016 06/16/15   Gardiner Barefootobert W Dezman Granda, MD  ritonavir (NORVIR) 100 MG TABS tablet Take 1 tablet (100 mg total) by mouth daily. Patient not taking: Reported on 02/16/2016 06/16/15   Gardiner Barefootobert W Myleigh Amara, MD  TIVICAY 50 MG tablet TAKE 1 TABLET BY MOUTH EVERY DAY Patient not taking: Reported on 02/16/2016 06/16/15   Gardiner Barefootobert W Jnyah Brazee, MD    Review of  Systems Constitutional: negative for fevers and chills Integument/breast: positive for rash, negative for dryness Musculoskeletal: positive for myalgias, arthralgias and stiff joints, negative for muscle weakness All other systems reviewed and are negative   Physical Exam: CONSTITUTIONAL:in no apparent distress and alert  Filed Vitals:   02/16/16 0853  BP: 114/69  Pulse: 98  Temp: 98.1 F (36.7 C)   Eyes: anicteric HENT: no thrush, no cervical lymphadenopathy Respiratory: Normal respiratory effort; CTA B MS: deformities in left toes c/w arthritis  Lab Results  Component Value Date   HIV1RNAQUANT <20 08/18/2015   HIV1RNAQUANT 27* 06/14/2015   HIV1RNAQUANT 52* 03/28/2015   No components found for: HIV1GENOTYPRPLUS No components found for: THELPERCELL

## 2016-02-16 NOTE — Assessment & Plan Note (Signed)
precontemplative. 

## 2016-02-16 NOTE — Assessment & Plan Note (Signed)
Will restart ARVs.  If doing ok, can use Descovy with next refill. Labs today and rtc 5 weeks for labs and then with me.

## 2016-02-17 LAB — HIV-1 RNA QUANT-NO REFLEX-BLD
HIV 1 RNA QUANT: 38369 {copies}/mL — AB (ref <20)
HIV-1 RNA Quant, Log: 4.58 Log copies/mL — ABNORMAL HIGH (ref <1.30)

## 2016-02-17 LAB — T-HELPER CELL (CD4) - (RCID CLINIC ONLY)
CD4 % Helper T Cell: 22 % — ABNORMAL LOW (ref 33–55)
CD4 T CELL ABS: 320 /uL — AB (ref 400–2700)

## 2016-03-13 ENCOUNTER — Telehealth: Payer: Self-pay | Admitting: *Deleted

## 2016-03-13 NOTE — Telephone Encounter (Signed)
RN shared lab results with the pt.  VL increased from <20, 6 months ago to over 36,000.  RN asked if the pt has restarted his HIV medications.  He stated that he restarted after last office visit.  RN scheduled the pt for a Lab Visit prior to his visit with Dr. Luciana Axeomer.  Pt asked about his referral to Rheumatologist.  RN reviewed Dr. Ephriam Knucklesomer's office notes.  Per Dr. Ephriam Knucklesomer's 02/16/16 office notes the pt was to be referred to rheumatology.  RN will check with the CMA to see about the referral.  The patient has Medicaid.  Phone call to the patient.  Needing copy of current Medicaid card to process referral.  Patient agreed to bring his Medicaid card to [facility] to be scanned into EP[employer].

## 2016-03-26 ENCOUNTER — Other Ambulatory Visit: Payer: Medicaid Other

## 2016-04-03 ENCOUNTER — Encounter: Payer: Self-pay | Admitting: Internal Medicine

## 2016-04-03 ENCOUNTER — Ambulatory Visit (INDEPENDENT_AMBULATORY_CARE_PROVIDER_SITE_OTHER): Payer: Medicaid Other | Admitting: Internal Medicine

## 2016-04-03 VITALS — BP 109/72 | HR 78 | Temp 98.6°F | Wt 142.0 lb

## 2016-04-03 DIAGNOSIS — B20 Human immunodeficiency virus [HIV] disease: Secondary | ICD-10-CM

## 2016-04-03 DIAGNOSIS — L4050 Arthropathic psoriasis, unspecified: Secondary | ICD-10-CM | POA: Diagnosis not present

## 2016-04-03 MED ORDER — COAL TAR EXTRACT 10 % EX SHAM: 1.0000 "application " | MEDICATED_SHAMPOO | CUTANEOUS | Status: DC | PRN

## 2016-04-03 NOTE — Assessment & Plan Note (Signed)
On ibuprofen, referred to rheumatology

## 2016-04-03 NOTE — Progress Notes (Signed)
CC: Follow up for HIV  Interval history: Back for his second follow up after initially saying he was moving back to [place].  He also has been seen by dermatology for his psoriatic arthritis. He was placed on MTX by his dermatologist and developed a rash.  Has no associated n/v or weight loss.   Still with arthritis and worsening rash.  Can't go to pain clinic due to marijuana use.  Was referred to rheumatology again.     Prior to Admission medications   Medication Sig Start Date End Date Taking? Authorizing Provider  ibuprofen (ADVIL,MOTRIN) 800 MG tablet Take 1 tablet (800 mg total) by mouth every 8 (eight) hours as needed. 09/22/15  Yes Gardiner Barefootobert W Almarie Kurdziel, MD  Coal Tar Extract 10 % SHAM Apply 1 application topically as needed. Ok to use 3% if 2% not available.Use 2-3 times week. Patient not taking: Reported on 02/16/2016 05/31/15   Gardiner Barefootobert W Jaren Kearn, MD  emtricitabine-tenofovir (TRUVADA) 200-300 MG per tablet Take 1 tablet by mouth daily. Patient not taking: Reported on 02/16/2016 06/16/15   Gardiner Barefootobert W Kailana Benninger, MD  fluocinonide cream (LIDEX) 0.05 % Apply 1 application topically 2 (two) times daily. Patient not taking: Reported on 02/16/2016 01/02/16   Gardiner Barefootobert W Shameka Aggarwal, MD  predniSONE (DELTASONE) 20 MG tablet Take 1 tablet (20 mg total) by mouth daily with breakfast. Patient not taking: Reported on 02/16/2016 09/15/15   Gardiner Barefootobert W Eleanor Gatliff, MD  PREZISTA 800 MG tablet TAKE 1 TABLET BY MOUTH EVERY DAY Patient not taking: Reported on 02/16/2016 06/16/15   Gardiner Barefootobert W Jeoffrey Eleazer, MD  ritonavir (NORVIR) 100 MG TABS tablet Take 1 tablet (100 mg total) by mouth daily. Patient not taking: Reported on 02/16/2016 06/16/15   Gardiner Barefootobert W Andrell Bergeson, MD  TIVICAY 50 MG tablet TAKE 1 TABLET BY MOUTH EVERY DAY Patient not taking: Reported on 02/16/2016 06/16/15   Gardiner Barefootobert W Leonia Heatherly, MD    Review of Systems Constitutional: negative for fevers and chills Integument/breast: positive for rash, negative for dryness Musculoskeletal: positive for myalgias,  arthralgias and stiff joints, negative for muscle weakness All other systems reviewed and are negative   Physical Exam: CONSTITUTIONAL:in no apparent distress and alert  Eyes: anicteric HENT: no thrush, no cervical lymphadenopathy Respiratory: Normal respiratory effort; CTA B MS: deformities in left toes c/w arthritis  Lab Results  Component Value Date   HIV1RNAQUANT 38369* 02/16/2016   HIV1RNAQUANT <20 08/18/2015   HIV1RNAQUANT 27* 06/14/2015

## 2016-04-03 NOTE — Assessment & Plan Note (Signed)
Back on his regimen again, will recheck.  rtc 4 months if ok.  May be moving to [place] again.

## 2016-04-04 LAB — HIV-1 RNA ULTRAQUANT REFLEX TO GENTYP+
HIV 1 RNA QUANT: 37 {copies}/mL — AB (ref <20)
HIV-1 RNA QUANT, LOG: 1.57 {Log_copies}/mL — AB (ref <1.30)

## 2016-04-04 LAB — T-HELPER CELL (CD4) - (RCID CLINIC ONLY)
CD4 % Helper T Cell: 28 % — ABNORMAL LOW (ref 33–55)
CD4 T Cell Abs: 410 /uL (ref 400–2700)

## 2016-04-09 ENCOUNTER — Telehealth: Payer: Self-pay | Admitting: *Deleted

## 2016-04-09 NOTE — Telephone Encounter (Signed)
Patient called, asking if there was a blood test for cancer.  He states that he has a nickel-sized lump in his gluteal cleft that he is worried may be cancer. He states it has been there "a while" and is soft, non-tender.  [place] has [place] [employer]; all referrals need to come from primary care.  Patient in office last week, no mention of this spot at that visit.  RN advised him that he should start with his primary care doctor for blood work and referrals for follow up.  Patient also informed about the anchor study [facility] at [facility] for an additional resource.  Pt verbalized understanding, agreement. Andree CossHowell, Tykera Skates M, RN

## 2016-04-10 LAB — HIV-1 INTEGRASE GENOTYPE

## 2016-06-25 ENCOUNTER — Ambulatory Visit: Payer: Medicaid Other | Admitting: Internal Medicine

## 2016-08-14 ENCOUNTER — Telehealth: Payer: Self-pay | Admitting: *Deleted

## 2016-08-14 NOTE — Telephone Encounter (Signed)
Patient called stating he was "gone" for awhile and needed to get back in to see the doc. He stated he was in jail and was suppose to come last month, but refused to come in shackles. Scheduled him for a lab appt on Thursday and follow up MD on 08/30/16. Wendall MolaJacqueline Cockerham

## 2016-08-16 ENCOUNTER — Other Ambulatory Visit: Payer: Medicaid Other

## 2016-08-16 DIAGNOSIS — Z113 Encounter for screening for infections with a predominantly sexual mode of transmission: Secondary | ICD-10-CM

## 2016-08-16 DIAGNOSIS — B20 Human immunodeficiency virus [HIV] disease: Secondary | ICD-10-CM

## 2016-08-16 DIAGNOSIS — Z79899 Other long term (current) drug therapy: Secondary | ICD-10-CM

## 2016-08-16 LAB — LIPID PANEL
CHOL/HDL RATIO: 5.2 ratio — AB (ref <=5.0)
Cholesterol: 193 mg/dL (ref 125–200)
HDL: 37 mg/dL — AB (ref >=40)
LDL CALC: 138 mg/dL — AB (ref <130)
TRIGLYCERIDES: 92 mg/dL (ref <150)
VLDL: 18 mg/dL (ref <30)

## 2016-08-16 LAB — CBC WITH DIFFERENTIAL/PLATELET
BASOS PCT: 1 %
Basophils Absolute: 77 cells/uL (ref 0–200)
EOS PCT: 2 %
Eosinophils Absolute: 154 cells/uL (ref 15–500)
HEMATOCRIT: 42.8 % (ref 38.5–50.0)
Hemoglobin: 14.8 g/dL (ref 13.2–17.1)
LYMPHS PCT: 13 %
Lymphs Abs: 1001 cells/uL (ref 850–3900)
MCH: 32.2 pg (ref 27.0–33.0)
MCHC: 34.6 g/dL (ref 32.0–36.0)
MCV: 93 fL (ref 80.0–100.0)
MONO ABS: 1309 {cells}/uL — AB (ref 200–950)
MONOS PCT: 17 %
MPV: 9.2 fL (ref 7.5–12.5)
NEUTROS PCT: 67 %
Neutro Abs: 5159 cells/uL (ref 1500–7800)
PLATELETS: 324 10*3/uL (ref 140–400)
RBC: 4.6 MIL/uL (ref 4.20–5.80)
RDW: 13.6 % (ref 11.0–15.0)
WBC: 7.7 10*3/uL (ref 3.8–10.8)

## 2016-08-16 LAB — COMPLETE METABOLIC PANEL WITH GFR
ALT: 10 U/L (ref 9–46)
AST: 15 U/L (ref 10–40)
Albumin: 4.1 g/dL (ref 3.6–5.1)
Alkaline Phosphatase: 61 U/L (ref 40–115)
BILIRUBIN TOTAL: 0.4 mg/dL (ref 0.2–1.2)
BUN: 19 mg/dL (ref 7–25)
CHLORIDE: 109 mmol/L (ref 98–110)
CO2: 22 mmol/L (ref 20–31)
Calcium: 9.2 mg/dL (ref 8.6–10.3)
Creat: 1.06 mg/dL (ref 0.60–1.35)
GFR, EST NON AFRICAN AMERICAN: 85 mL/min (ref >=60)
GLUCOSE: 95 mg/dL (ref 65–99)
POTASSIUM: 4.2 mmol/L (ref 3.5–5.3)
SODIUM: 139 mmol/L (ref 135–146)
Total Protein: 7.4 g/dL (ref 6.1–8.1)

## 2016-08-17 LAB — RPR

## 2016-08-17 LAB — T-HELPER CELL (CD4) - (RCID CLINIC ONLY)
CD4 T CELL ABS: 260 /uL — AB (ref 400–2700)
CD4 T CELL HELPER: 20 % — AB (ref 33–55)

## 2016-08-20 LAB — HIV-1 RNA QUANT-NO REFLEX-BLD

## 2016-08-30 ENCOUNTER — Ambulatory Visit: Payer: Medicaid Other | Admitting: Internal Medicine

## 2016-08-31 ENCOUNTER — Telehealth: Payer: Self-pay

## 2016-08-31 NOTE — Telephone Encounter (Signed)
Patient called inquiring when was his appointment. Notified patient that his appointment was scheduled for yesterday. Patient stated he wanted did not know about appointment. Stated his mobile number has changed. New number placed in chart. Rescheduled for first available appointment with Dr. Luciana Axeomer.  Patient asked for lab results. Results given. Patient was happy with results and stated he has not taken his medication for 3 or 4 months due to him refusing medication while being locked up. Explained to patient that even though his Viral load and CD4 is good now, he can build a resistance that make his medication regimen ineffective once he starts taking medication. Patient stated understanding but still stated he has not been on medication for as long as a year. Patient aware of new appointment. Rejeana Brockandace Adrain Nesbit, LPN

## 2016-10-02 ENCOUNTER — Ambulatory Visit (INDEPENDENT_AMBULATORY_CARE_PROVIDER_SITE_OTHER): Payer: Medicaid Other | Admitting: Internal Medicine

## 2016-10-02 ENCOUNTER — Encounter: Payer: Self-pay | Admitting: Internal Medicine

## 2016-10-02 DIAGNOSIS — L4050 Arthropathic psoriasis, unspecified: Secondary | ICD-10-CM | POA: Diagnosis not present

## 2016-10-02 DIAGNOSIS — B20 Human immunodeficiency virus [HIV] disease: Secondary | ICD-10-CM

## 2016-10-03 NOTE — Progress Notes (Signed)
CC: Follow up for HIV  Interval history: Back for his second follow up.   Still with arthritis and worsening rash.  Has not been back to rheumatology.  Has been off his HIV medications due to 'not feeling like it' and not concerned with restarting.  Says again he is going to move back to [place].       Prior to Admission medications   Medication Sig Start Date End Date Taking? Authorizing Provider  ibuprofen (ADVIL,MOTRIN) 800 MG tablet Take 1 tablet (800 mg total) by mouth every 8 (eight) hours as needed. 09/22/15  Yes Gardiner Barefootobert W Lalita Ebel, MD  Coal Tar Extract 10 % SHAM Apply 1 application topically as needed. Ok to use 3% if 2% not available.Use 2-3 times week. Patient not taking: Reported on 02/16/2016 05/31/15   Gardiner Barefootobert W Liboria Putnam, MD  emtricitabine-tenofovir (TRUVADA) 200-300 MG per tablet Take 1 tablet by mouth daily. Patient not taking: Reported on 02/16/2016 06/16/15   Gardiner Barefootobert W Tineshia Becraft, MD  fluocinonide cream (LIDEX) 0.05 % Apply 1 application topically 2 (two) times daily. Patient not taking: Reported on 02/16/2016 01/02/16   Gardiner Barefootobert W Eavan Gonterman, MD  predniSONE (DELTASONE) 20 MG tablet Take 1 tablet (20 mg total) by mouth daily with breakfast. Patient not taking: Reported on 02/16/2016 09/15/15   Gardiner Barefootobert W Daxton Nydam, MD  PREZISTA 800 MG tablet TAKE 1 TABLET BY MOUTH EVERY DAY Patient not taking: Reported on 02/16/2016 06/16/15   Gardiner Barefootobert W Justino Boze, MD  ritonavir (NORVIR) 100 MG TABS tablet Take 1 tablet (100 mg total) by mouth daily. Patient not taking: Reported on 02/16/2016 06/16/15   Gardiner Barefootobert W Talor Desrosiers, MD  TIVICAY 50 MG tablet TAKE 1 TABLET BY MOUTH EVERY DAY Patient not taking: Reported on 02/16/2016 06/16/15   Gardiner Barefootobert W Kendy Haston, MD    Review of Systems Constitutional: negative for fevers and chills Integument/breast: positive for rash, negative for dryness Musculoskeletal: positive for myalgias, arthralgias and stiff joints, negative for muscle weakness All other systems reviewed and are negative   Physical  Exam: CONSTITUTIONAL:in no apparent distress and alert  Eyes: anicteric HENT: no thrush, no cervical lymphadenopathy Skin: psoriasis on elbow   Lab Results  Component Value Date   HIV1RNAQUANT <20 08/16/2016   HIV1RNAQUANT 37 (H) 04/03/2016   HIV1RNAQUANT 38,369 (H) 02/16/2016

## 2016-10-03 NOTE — Assessment & Plan Note (Signed)
Discussed he should start treatment but only if he is ready.   He will call back to our office if he does not move to [place] for follow up

## 2016-10-03 NOTE — Assessment & Plan Note (Signed)
Recommended to go back to rheumatology

## 2017-12-26 ENCOUNTER — Telehealth: Payer: Self-pay | Admitting: *Deleted

## 2017-12-26 NOTE — Telephone Encounter (Signed)
Patient called from [facility] stating he has been out of care, is back in [place], wants [facility]. RN spoke with pharmacy, gave appointment for Monday at 10.  Patient accepted. Patient asked about pain medication, stating his psoriatic arthritis is so out of control he can barely walk. RN encouraged patient to engage with primary care, rheumatology, pain clinic again, as Dr Luciana Axeomer would not be managing his psoriatic arthritis.  Phone number updated, address confirmed. Andree CossHowell, Clementina Mareno M, RN

## 2017-12-30 ENCOUNTER — Ambulatory Visit (INDEPENDENT_AMBULATORY_CARE_PROVIDER_SITE_OTHER): Payer: Medicaid Other | Admitting: Pharmacist

## 2017-12-30 ENCOUNTER — Other Ambulatory Visit: Payer: Self-pay | Admitting: Pharmacist

## 2017-12-30 DIAGNOSIS — B20 Human immunodeficiency virus [HIV] disease: Secondary | ICD-10-CM | POA: Diagnosis not present

## 2017-12-30 DIAGNOSIS — L409 Psoriasis, unspecified: Secondary | ICD-10-CM

## 2017-12-30 DIAGNOSIS — Z23 Encounter for immunization: Secondary | ICD-10-CM

## 2017-12-30 LAB — COMPREHENSIVE METABOLIC PANEL
AG Ratio: 1.1 (calc) (ref 1.0–2.5)
ALT: 9 U/L (ref 9–46)
AST: 14 U/L (ref 10–40)
Albumin: 4 g/dL (ref 3.6–5.1)
Alkaline phosphatase (APISO): 56 U/L (ref 40–115)
BILIRUBIN TOTAL: 0.2 mg/dL (ref 0.2–1.2)
BUN: 22 mg/dL (ref 7–25)
CO2: 23 mmol/L (ref 20–32)
CREATININE: 1.02 mg/dL (ref 0.60–1.35)
Calcium: 9.5 mg/dL (ref 8.6–10.3)
Chloride: 108 mmol/L (ref 98–110)
Globulin: 3.5 g/dL (calc) (ref 1.9–3.7)
Glucose, Bld: 92 mg/dL (ref 65–99)
POTASSIUM: 4.3 mmol/L (ref 3.5–5.3)
SODIUM: 140 mmol/L (ref 135–146)
TOTAL PROTEIN: 7.5 g/dL (ref 6.1–8.1)

## 2017-12-30 LAB — CBC
HEMATOCRIT: 38.1 % — AB (ref 38.5–50.0)
Hemoglobin: 13 g/dL — ABNORMAL LOW (ref 13.2–17.1)
MCH: 29 pg (ref 27.0–33.0)
MCHC: 34.1 g/dL (ref 32.0–36.0)
MCV: 84.9 fL (ref 80.0–100.0)
MPV: 8.8 fL (ref 7.5–12.5)
Platelets: 340 10*3/uL (ref 140–400)
RBC: 4.49 10*6/uL (ref 4.20–5.80)
RDW: 13.6 % (ref 11.0–15.0)
WBC: 7.4 10*3/uL (ref 3.8–10.8)

## 2017-12-30 LAB — LIPID PANEL
CHOL/HDL RATIO: 4.3 (calc) (ref <5.0)
Cholesterol: 156 mg/dL (ref <200)
HDL: 36 mg/dL — ABNORMAL LOW (ref >40)
LDL CHOLESTEROL (CALC): 104 mg/dL — AB
NON-HDL CHOLESTEROL (CALC): 120 mg/dL (ref <130)
Triglycerides: 75 mg/dL (ref <150)

## 2017-12-30 MED ORDER — BETAMETHASONE VALERATE 0.1 % EX CREA: TOPICAL_CREAM | Freq: Two times a day (BID) | CUTANEOUS | 5 refills | Status: DC

## 2017-12-30 MED ORDER — DOLUTEGRAVIR SODIUM 50 MG PO TABS: 50.0000 mg | ORAL_TABLET | Freq: Every day | ORAL | 5 refills | Status: DC

## 2017-12-30 MED ORDER — FLUOCINONIDE 0.05 % EX CREA: 1.0000 "application " | TOPICAL_CREAM | Freq: Two times a day (BID) | CUTANEOUS | 5 refills | Status: DC

## 2017-12-30 MED ORDER — FLUOCINONIDE 0.05 % EX SOLN: 1.0000 "application " | Freq: Two times a day (BID) | CUTANEOUS | 5 refills | Status: DC

## 2017-12-30 MED ORDER — BETAMETHASONE DIPROPIONATE 0.05 % EX CREA: TOPICAL_CREAM | Freq: Two times a day (BID) | CUTANEOUS | 5 refills | Status: DC

## 2017-12-30 MED ORDER — DARUN-COBIC-EMTRICIT-TENOFAF 800-150-200-10 MG PO TABS: 1.0000 | ORAL_TABLET | Freq: Every day | ORAL | 5 refills | Status: DC

## 2017-12-30 MED FILL — SYMTUZA 800-150-200-10 MG T: 800-150-200 | 30 days supply | Qty: 30 | Fill #0

## 2017-12-30 MED FILL — BETAMETHASONE VALERATE 0.1: 0.1 | 7 days supply | Qty: 30 | Fill #0

## 2017-12-30 MED FILL — TIVICAY 50 MG TABLET: 50 | 30 days supply | Qty: 30 | Fill #0

## 2017-12-30 NOTE — Addendum Note (Signed)
Addended by: Robinette HainesKUPPELWEISER, Jenin Birdsall L on: 12/30/2017 04:55 PM   Modules accepted: Orders

## 2017-12-30 NOTE — Progress Notes (Signed)
bb

## 2017-12-30 NOTE — Progress Notes (Signed)
HPI: Garrett Schroeder is a 47 y.o. male who presents for HIV care for the first time since 10/02/2016.   Allergies: Allergies  Allergen Reactions  . Codeine Itching   Past Medical History: Past Medical History:  Diagnosis Date  . HIV infection (HCC)   . 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 Marital status: Single    Spouse name: Not on file  . Number of children: Not on file  . Years of education: Not on file  . Highest education level: Not on file  Social Needs  . Financial resource strain: Not on file  . Food insecurity - worry: Not on file  . Food insecurity - inability: Not on file  . Transportation needs - medical: Not on file  . Transportation needs - non-medical: Not on file  Occupational History  . Not on file  Tobacco Use  . Smoking status: Current Every Day Smoker    Packs/day: 1.50    Years: 30.00    Pack years: 45.00    Types: Cigarettes  . Smokeless tobacco: Never Used  Substance and Sexual Activity  . Alcohol use: Yes    Alcohol/week: 3.0 oz    Types: 5 Standard drinks or equivalent per week    Comment: states "I drink alot"  . Drug use: Yes    Frequency: 20.0 times per week    Types: Marijuana  . Sexual activity: Yes    Partners: Female    Comment: declined condoms  Other Topics Concern  . Not on file  Social History Narrative  . Not on file    Previous Regimen: Isentress + Truvada + Prezcobix + Norvir  Current Regimen: None  Labs: HIV 1 RNA Quant (copies/mL)  Date Value  08/16/2016 <20  04/03/2016 37 (H)  02/16/2016 38,369 (H)   CD4 T Cell Abs (/uL)  Date Value  08/16/2016 260 (L)  04/03/2016 410  02/16/2016 320 (L)   Hep B S Ab (no units)  Date Value  12/30/2006 NO   Hepatitis B Surface Ag (no units)  Date Value  02/11/2008 NEGATIVE   HCV Ab (no units)  Date Value  02/11/2008 NEGATIVE    CrCl: CrCl cannot be calculated (Patient's most recent lab result is  older than the maximum 21 days allowed.).  Lipids:    Component Value Date/Time   CHOL 193 08/16/2016 1107   TRIG 92 08/16/2016 1107   HDL 37 (L) 08/16/2016 1107   CHOLHDL 5.2 (H) 08/16/2016 1107   VLDL 18 08/16/2016 1107   LDLCALC 138 (H) 08/16/2016 1107    Assessment:  HIV management: Garrett Schroeder has been off his HIV medications since August 2017. He was in jail from July 2017 - September 2017. He stopped taking medications because he decided he "just didn't feel like it". He says he would like to restart his medications so that people will stop bothering him about it. He has no signs of being sick recently. He was previously on Isentress, Truvada, Prezcobix, and Norvir. He does not report having any problems with the pill burden of this regimen, but today we will switch him to [place] and Tivicay. He was counseled on how to take these medications including to take Symtuza with food.  Garrett Schroeder used to live in [place], where he was [place] [facility] [place] and making about $15,000/month. He lived a promiscuous lifestyle during this time and this is when he contracted HIV. He also had  approximately 3 events of IV drug use but says he always used clean needles. He does not use any illicit drugs at this time except marijuana daily. Garrett Schroeder has been very sick with HIV in the past. He weighed approximately 97 lbs in 2004 and was sent to [facility] place and told he had a short lifespan. He says he was very unhappy that he was told he would not live, and then he was able to make a recovery. Garrett Schroeder has not received a flu shot this year, and agrees to get one today.  Psoriatic arthritis: Garrett Schroeder also complains of pain and visual joint deformity from his psoriatic arthritis. He currently ambulates with a cane. He has not seen a rheumatologist and can not go to the pain clinic because he refuses to stop smoking marijuana. He was recently seen at [facility] ED, but left because he was not satisfied with his wait time and  the service he received. He endorses taking "a handful" or about 10-15 ibuprofen to control his joint pain because nothing else will work. Tadeo was educated on the dangers of kidney damage and stomach bleeding with this amount of ibuprofen, but he says he is not worried about it because he doesn't care if he dies. Willford says that in [place], he was prescribed Oxycodone 40 mg for his pain. He says he hates opiates and doesn't want to take them but they worked to control his pain. Garrett Schroeder requested pain medication multiple times. He will see Garrett Schroeder April 4 at 10:15  Depression: Garrett Schroeder has severe depression, He reports that last week he put a gun in his mouth but did not pull the trigger because his "mom would be disappointed". He denies feeling suicidal or homicidal today in clinic. His depression stems from his pain, his HIV, and his recent break up with girlfriend on new years eve. He has an appointment with [facility] on March 10th. He is also interested in speaking to a counselor here if his appointment with Floydene Flock is not helpful at providing mental health support. We will reassess at his visit with Garrett Schroeder.   Psoriasis: Garrett Schroeder has uncontrolled psoriasis with 7-9 plaques on his abdomen and plaques on his scalp. He says he would like to be able to shave his head again. He requests a cream and a solution for his head that he recalls working for him in the past.  Garrett Schroeder now has Medicaid. He will receive his HIV medications from [facility] and they will be mailed to his house.   Recommendations: -Discontinue Isentress, Truvada, Prezcobix, and Norvir -Initiate Symtuza + Tivicay -Initiate fluocinonide solution and topical cream for psoriasis -Administer flu shot -Obtain labs (HIV VL, CD4, CBC, BMET) -F/u with Garrett Schroeder on April 4  Garrett Schroeder, PharmD Candidate [employer] 12/30/2017, 11:25 AM

## 2017-12-31 LAB — T-HELPER CELL (CD4) - (RCID CLINIC ONLY)
CD4 T CELL HELPER: 20 % — AB (ref 33–55)
CD4 T Cell Abs: 210 /uL — ABNORMAL LOW (ref 400–2700)

## 2018-01-10 LAB — HIV-1 GENOTYPE: HIV-1 GENOTYPE: DETECTED — AB

## 2018-01-10 LAB — HIV RNA, RTPCR W/R GT (RTI, PI,INT)
HIV 1 RNA Quant: 22400 copies/mL — ABNORMAL HIGH
HIV-1 RNA Quant, Log: 4.35 Log copies/mL — ABNORMAL HIGH

## 2018-01-10 LAB — HIV-1 INTEGRASE GENOTYPE

## 2018-01-29 MED FILL — TIVICAY 50 MG TABLET: 50 | 30 days supply | Qty: 30 | Fill #1

## 2018-01-29 MED FILL — SYMTUZA 800-150-200-10 MG T: 800-150-200 | 30 days supply | Qty: 30 | Fill #1

## 2018-01-29 MED FILL — BETAMETHASONE VALERATE 0.1: 0.1 | 7 days supply | Qty: 30 | Fill #1

## 2018-02-06 ENCOUNTER — Ambulatory Visit (INDEPENDENT_AMBULATORY_CARE_PROVIDER_SITE_OTHER): Payer: Medicaid Other | Admitting: Internal Medicine

## 2018-02-06 ENCOUNTER — Encounter: Payer: Self-pay | Admitting: Internal Medicine

## 2018-02-06 VITALS — BP 104/67 | HR 70 | Temp 98.6°F | Ht 65.0 in | Wt 135.0 lb

## 2018-02-06 DIAGNOSIS — F329 Major depressive disorder, single episode, unspecified: Secondary | ICD-10-CM

## 2018-02-06 DIAGNOSIS — M959 Acquired deformity of musculoskeletal system, unspecified: Secondary | ICD-10-CM

## 2018-02-06 DIAGNOSIS — F32A Depression, unspecified: Secondary | ICD-10-CM

## 2018-02-06 DIAGNOSIS — L4050 Arthropathic psoriasis, unspecified: Secondary | ICD-10-CM | POA: Diagnosis not present

## 2018-02-06 DIAGNOSIS — B20 Human immunodeficiency virus [HIV] disease: Secondary | ICD-10-CM

## 2018-02-06 MED ORDER — IBUPROFEN 800 MG PO TABS: 800.0000 mg | ORAL_TABLET | Freq: Three times a day (TID) | ORAL | 5 refills | Status: DC | PRN

## 2018-02-06 NOTE — Assessment & Plan Note (Signed)
I will refer to orthopedics for this to see if surgical correction is an option.

## 2018-02-06 NOTE — Assessment & Plan Note (Signed)
Will check labs today.  rtc 3-4 months.

## 2018-02-06 NOTE — Assessment & Plan Note (Signed)
No current SI and has appt with psychiatry.  I discussed [facility] if he becomes actively suicidal and to call or go to the ED.

## 2018-02-06 NOTE — Assessment & Plan Note (Addendum)
I will refer to rheumatology Given ibuprofen prescription to use twice a day

## 2018-02-06 NOTE — Progress Notes (Signed)
   Subjective:    Patient ID: Garrett DarnerKeith Roderick, male    DOB: 10/15/1971, 47 y.o.   MRN: [id number]  HPI Here for follow up of HIV He has been out of care here since 2017.  At the time he moved back to [place] but recently moved back after losing a girlfriend and losing a place to live.  He had been off his medications since 2017.  His last labs were in February when he saw our pharmacist and CD4 of 210 and viral load 22,400.  He was started then on Symtuza and Tivicay and has been taking daily except yesterday.  No issues with that. He continues to complain of psoriatic arthritis and has not been back to rheumatology. He complains of deformities of of his fingers and toes and would like to see if they can be surgically corrected.   He reports recent suicidal ideation but currently has no suicidal ideation and has not developed a plan.  [facility] and has an appt with a psychiatrist on 4/11.  He reports he has a history of bipolar and that he is "batshit crazy".  Also has plans for vacation around his birthday in June.     Review of Systems  Constitutional: Negative for unexpected weight change.  Gastrointestinal: Negative for diarrhea and nausea.  Skin: Negative for rash.  Psychiatric/Behavioral: Negative for self-injury and suicidal ideas.       Objective:   Physical Exam  Constitutional: He appears well-developed and well-nourished. No distress.  HENT:  Mouth/Throat: No oropharyngeal exudate.  Eyes: No scleral icterus.  Cardiovascular: Normal rate, regular rhythm and normal heart sounds.  No murmur heard. Pulmonary/Chest: Effort normal and breath sounds normal. No respiratory distress.  Musculoskeletal: He exhibits deformity.  90 degree deformity of his pointer finger Multiple toe deformities    SH: + Tobacco      Assessment & Plan:

## 2018-02-07 LAB — T-HELPER CELL (CD4) - (RCID CLINIC ONLY)
CD4 T CELL ABS: 450 /uL (ref 400–2700)
CD4 T CELL HELPER: 24 % — AB (ref 33–55)

## 2018-02-08 LAB — HIV-1 RNA ULTRAQUANT REFLEX TO GENTYP+

## 2018-02-11 ENCOUNTER — Ambulatory Visit (INDEPENDENT_AMBULATORY_CARE_PROVIDER_SITE_OTHER): Payer: Self-pay

## 2018-02-11 ENCOUNTER — Ambulatory Visit (INDEPENDENT_AMBULATORY_CARE_PROVIDER_SITE_OTHER): Payer: Medicaid Other | Admitting: Orthopaedic Surgery

## 2018-02-11 ENCOUNTER — Encounter (INDEPENDENT_AMBULATORY_CARE_PROVIDER_SITE_OTHER): Payer: Self-pay | Admitting: Orthopaedic Surgery

## 2018-02-11 VITALS — BP 101/63 | HR 73 | Resp 16 | Ht 66.0 in | Wt 135.0 lb

## 2018-02-11 DIAGNOSIS — M79671 Pain in right foot: Secondary | ICD-10-CM

## 2018-02-11 DIAGNOSIS — M79642 Pain in left hand: Secondary | ICD-10-CM | POA: Diagnosis not present

## 2018-02-11 DIAGNOSIS — M79641 Pain in right hand: Secondary | ICD-10-CM

## 2018-02-11 DIAGNOSIS — M79672 Pain in left foot: Secondary | ICD-10-CM

## 2018-02-11 NOTE — Progress Notes (Signed)
Office Visit Note   Patient: Garrett Schroeder           Date of Birth: [birth year]           MRN: [id number] Visit Date: 02/11/2018              Requested by: Gardiner Barefoot, MD [address] [place], [place] [address] PCP: Patient, No Pcp Per   Assessment & Plan: Visit Diagnoses:  1. Pain in both feet   2. Pain in both hands     Plan: Complicated clinical picture.  Long discussion regarding the fixed flexion contractures of the right ring finger DIP joint and left long finger DIP joint.  Might require surgery but he may be happy to leave the fingers as is.  Also had a long discussion regarding deformities of his feet would require surgery.  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  I think the real problem is his psoriatic arthritis which appears to be out of control.  I will make an appointment for him to be seen in the rheumatology department at [facility] then plan to see him back as needed.  The meantime he will continue with the ibuprofen.  Total time spent was over an hour regarding all of the issues.  Follow-Up Instructions: No follow-ups on file.   Orders:  Orders Placed This Encounter  Procedures  . XR Hand Complete Left  . XR Hand Complete Right  . XR Foot 2 Views Left  . XR Foot 2 Views Right   No orders of the defined types were placed in this encounter.     Procedures: No procedures performed   Clinical Data: No additional findings.   Subjective: Chief Complaint  Patient presents with  . Right Foot - Pain  . Left Foot - Pain  . Lower Back - Pain  . New Patient (Initial Visit)    LOWER BACK PAIN FOR YEARS ALSO HAS BIL LAT HAND AND FEET DIFORMITY AND PAIN FOR A YEAR OR SO  Long history of bilateral hand and foot pain.  Multiple comorbidities including positive HIV and history of psoriatic arthritis.  Has fixed flexion deformity of the DIP joint  left long finger  and right ring with pain.  Long history of psoriasis and psoriatic arthritis presently taking varied doses of ibuprofen.  Does not see a rheumatologist.  Apparently was denied pain clinic evaluation.  Does smoke marijuana to help with his pain.  Also has issues with both of his feet with pain and deformities.  Uses a cane to assist with ambulation that he relates "because of my foot deformities"  HPI  Review of Systems  Constitutional: Negative for fatigue and fever.  HENT: Negative for ear pain.   Eyes: Negative for pain.  Respiratory: Positive for cough and shortness of breath.   Cardiovascular: Positive for leg swelling.  Gastrointestinal: Negative for constipation and diarrhea.  Genitourinary: Negative for difficulty urinating.  Musculoskeletal: Positive for back pain and neck pain.  Skin: Negative for rash and wound.  Allergic/Immunologic: Negative for food allergies.  Neurological: Negative for weakness, light-headedness, numbness and headaches.  Hematological: Does not bruise/bleed easily.  Psychiatric/Behavioral: Positive for sleep disturbance.     Objective: Vital Signs: BP 101/63 (BP Location: Left Arm, Patient Position: Sitting, Cuff Size: Normal)   Pulse 73   Resp 16   Ht 5\' 6"  (1.676 m)  Wt 135 lb (61.2 kg)   BMI 21.79 kg/m   Physical Exam  Ortho Exam awake alert and oriented x3.  Examination of hands reveals diffuse pitting of nails consistent with his history of psoriasis.  90 degree fixed flexion contracture of DIP joint of left long finger and right ring finger  Able to make a fist.  Good capillary refill to fingers. Has significant deformity of both of his feet.  On the right he has valgus deformities all of the toes with pain on motion across the metatarsal phalangeal joints.  Fungal infection of all of the toes nails On the left he has similar position of the great toe with hallux valgus.  Pain with motion across the metatarsal phalangeal  joints.  Crossover deformities of the lesser toes.  Also has diffuse fungal infection.  Good capillary refill to toes.  Good arch.  Specialty Comments:  No specialty comments available.  Imaging: No results found.   PMFS History: Patient Active Problem List   Diagnosis Date Noted  . Depression 02/06/2018  . Bone deformity 02/06/2018  . Screening examination for venereal disease 02/16/2016  . Encounter for long-term (current) use of medications 02/16/2016  . Excess ear wax 04/20/2014  . Psoriatic arthritis (HCC) 05/19/2013  . Smoker 11/15/2011  . Hematuria 01/25/2011  . Human immunodeficiency virus (HIV) disease (HCC) 03/18/2008   Past Medical History:  Diagnosis Date  . HIV infection (HCC)   . Psoriatic arthritis mutilans (HCC)     Family History  Problem Relation Age of Onset  . Arthritis Neg Hx     History reviewed. No pertinent surgical history. Social History   Occupational History  . Not on file  Tobacco Use  . Smoking status: Current Every Day Smoker    Packs/day: 1.50    Years: 30.00    Pack years: 45.00    Types: Cigarettes  . Smokeless tobacco: Never Used  Substance and Sexual Activity  . Alcohol use: Yes    Alcohol/week: 3.0 oz    Types: 5 Standard drinks or equivalent per week  . Drug use: Yes    Frequency: 20.0 times per week    Types: Marijuana  . Sexual activity: Yes    Partners: Female    Comment: declined condoms

## 2018-02-13 ENCOUNTER — Other Ambulatory Visit (INDEPENDENT_AMBULATORY_CARE_PROVIDER_SITE_OTHER): Payer: Self-pay | Admitting: Radiology

## 2018-02-13 NOTE — Addendum Note (Signed)
Addended by: Thornell MuleVILLAREAL, Blayre Papania R on: 02/13/2018 09:08 AM   Modules accepted: Orders

## 2018-02-26 MED FILL — TIVICAY 50 MG TABLET: 50 | 30 days supply | Qty: 30 | Fill #2

## 2018-02-26 MED FILL — BETAMETHASONE VALERATE 0.1: 0.1 | 7 days supply | Qty: 30 | Fill #2

## 2018-02-26 MED FILL — SYMTUZA 800-150-200-10 MG T: 800-150-200 | 30 days supply | Qty: 30 | Fill #2

## 2018-03-20 ENCOUNTER — Telehealth: Payer: Self-pay | Admitting: *Deleted

## 2018-03-20 NOTE — Telephone Encounter (Signed)
Patient called to report that he is no longer taking his medication. Asked him why he said he just doesn't care so he flushed them down the toilet. Asked if he feels like he needs help or if he is going to hurt himself and he advised no he just doesn't care about anything. Advised him will make a note and let the doctor know.

## 2018-03-21 ENCOUNTER — Telehealth: Payer: Self-pay | Admitting: Pharmacist

## 2018-03-21 NOTE — Telephone Encounter (Signed)
[facility] called patient for a refill of his Tivicay and Symtuza and he told them that he was not taking those medications anymore and hung up on them.  I called Garrett Schroeder to see what the issue was and he did not answer.  I left a VM for him to call me back.

## 2018-03-21 NOTE — Telephone Encounter (Signed)
I see the documentation from [place] below.  Will d/c him from [facility] and not fill his meds anymore.

## 2018-03-25 ENCOUNTER — Telehealth: Payer: Self-pay

## 2018-03-25 NOTE — Telephone Encounter (Signed)
Pt called today requesting refills on all his meds stated that he was told by the pharmacy to call his doctors office before he could get any refills. PT stated during his call that he was unsure why he needed to call us since all he needed was a refill. Per last phone notes pt stated that he flushed his meds and did not want to take them. When I told the pt that he needed to make an appt to get some clarification on his meds he got annoyed and stated he didn't care if he got the meds or not. I was able to make the pt aware of the importance of staying on his meds and was able to have him agree to come see our pharmacist team at our clinic to go over his meds before receiving any more refills. Pt is scheduled to see our pharmacist 03/27/18 10 am. Lorenso Courier, CMA

## 2018-03-27 ENCOUNTER — Ambulatory Visit: Payer: Medicaid Other

## 2018-05-12 ENCOUNTER — Ambulatory Visit: Payer: Medicaid Other | Admitting: Internal Medicine

## 2018-07-15 ENCOUNTER — Telehealth: Payer: Self-pay | Admitting: Behavioral Health

## 2018-07-15 NOTE — Telephone Encounter (Signed)
Patient called stating he has missed his last appointment and not taking his HIV medications for the last few months.  Patient states he wants to return to care and see [name].  He is unable to get lab work ahead of time because he possibly has a new job and cannot miss time.  Appointment scheduled for tomorrow 07/16/2018 at 9:15. Angeline Slim RN

## 2018-07-16 ENCOUNTER — Ambulatory Visit (INDEPENDENT_AMBULATORY_CARE_PROVIDER_SITE_OTHER): Payer: Medicaid Other | Admitting: Internal Medicine

## 2018-07-16 ENCOUNTER — Encounter: Payer: Self-pay | Admitting: Internal Medicine

## 2018-07-16 VITALS — BP 106/71 | HR 80 | Temp 97.7°F | Ht 67.0 in | Wt 135.0 lb

## 2018-07-16 DIAGNOSIS — L409 Psoriasis, unspecified: Secondary | ICD-10-CM | POA: Diagnosis not present

## 2018-07-16 DIAGNOSIS — L4050 Arthropathic psoriasis, unspecified: Secondary | ICD-10-CM

## 2018-07-16 DIAGNOSIS — F32A Depression, unspecified: Secondary | ICD-10-CM

## 2018-07-16 DIAGNOSIS — B20 Human immunodeficiency virus [HIV] disease: Secondary | ICD-10-CM

## 2018-07-16 DIAGNOSIS — F329 Major depressive disorder, single episode, unspecified: Secondary | ICD-10-CM | POA: Diagnosis not present

## 2018-07-16 MED ORDER — DARUN-COBIC-EMTRICIT-TENOFAF 800-150-200-10 MG PO TABS: 1.0000 | ORAL_TABLET | Freq: Every day | ORAL | 5 refills | Status: DC

## 2018-07-16 MED ORDER — BETAMETHASONE VALERATE 0.1 % EX CREA: TOPICAL_CREAM | Freq: Two times a day (BID) | CUTANEOUS | 5 refills | Status: DC

## 2018-07-16 MED ORDER — FLUOCINONIDE 0.05 % EX SOLN: 1.0000 "application " | Freq: Two times a day (BID) | CUTANEOUS | 5 refills | Status: DC

## 2018-07-16 MED ORDER — IBUPROFEN 800 MG PO TABS: 800.0000 mg | ORAL_TABLET | Freq: Three times a day (TID) | ORAL | 5 refills | Status: DC | PRN

## 2018-07-16 MED ORDER — DOLUTEGRAVIR SODIUM 50 MG PO TABS: 50.0000 mg | ORAL_TABLET | Freq: Every day | ORAL | 5 refills | Status: DC

## 2018-07-16 MED FILL — BETAMETHASONE VALERATE 0.1: 0.1 | 15 days supply | Qty: 30 | Fill #0

## 2018-07-16 MED FILL — SYMTUZA 800-150-200-10 MG T: 800-150-200 | 30 days supply | Qty: 30 | Fill #0

## 2018-07-16 MED FILL — TIVICAY 50 MG TABLET: 50 | 30 days supply | Qty: 30 | Fill #0

## 2018-07-16 MED FILL — IBUPROFEN 800 MG TAB: 800 | 20 days supply | Qty: 60 | Fill #0

## 2018-07-16 NOTE — Assessment & Plan Note (Addendum)
Will refer to rheumatology again Given ibuprofen

## 2018-07-16 NOTE — Assessment & Plan Note (Signed)
Offered counseling.  No current suicidal ideation.  He will call to schedule

## 2018-07-16 NOTE — Progress Notes (Signed)
   Subjective:    Patient ID: Garrett Schroeder, male    DOB: 09/28/1971, 47 y.o.   MRN: [id number]  HPI Here for follow up of HIV I last saw him in April 2019 and he had restarted his regimen of Symtuza and Tivicay In February after a two year absence after moving to [place].  He then called later and told the staff he flushed his medications down the toilet and wasn't going to take them.  He later called back and wanted to restart but did not want to come in for an appointment or labs.  He though comes in now ready to restart treatment.  No new issues.  Continues to have issues with psoriatic arthritis and has been referred to multiple providers but has not followed up.  Has continued to take excessive ibuprofen.   Asking about housing, referral to rheumatology.     Review of Systems  Constitutional: Negative for fatigue.  Skin: Positive for rash.       C/w psoriatic arthritis       Objective:   Physical Exam  Constitutional: He appears well-developed and well-nourished. No distress.  HENT:  Mouth/Throat: No oropharyngeal exudate.  Eyes: No scleral icterus.  Cardiovascular: Normal rate, regular rhythm and normal heart sounds.  No murmur heard. Pulmonary/Chest: Effort normal and breath sounds normal. No respiratory distress.  Skin: Rash noted.   SH; + marijuana, no other drugs; + tobacco       Assessment & Plan:

## 2018-07-16 NOTE — Assessment & Plan Note (Signed)
Restart medications after labs today.  Repeat labs in 6 weeks and rtc 7 weeks

## 2018-07-17 LAB — T-HELPER CELL (CD4) - (RCID CLINIC ONLY)
CD4 % Helper T Cell: 22 % — ABNORMAL LOW (ref 33–55)
CD4 T Cell Abs: 270 /uL — ABNORMAL LOW (ref 400–2700)

## 2018-07-18 LAB — HIV-1 RNA QUANT-NO REFLEX-BLD
HIV 1 RNA QUANT: 14900 {copies}/mL — AB
HIV-1 RNA QUANT, LOG: 4.17 {Log_copies}/mL — AB

## 2018-07-28 ENCOUNTER — Telehealth: Payer: Self-pay | Admitting: *Deleted

## 2018-07-28 DIAGNOSIS — N529 Male erectile dysfunction, unspecified: Secondary | ICD-10-CM

## 2018-07-28 NOTE — Telephone Encounter (Signed)
Patient is having issues with maintaining erections. He would like viagra, does not currently have a primary care physician. RN offered to send in referral for PCP and for urology if needed (he has insurance, would [place] in [place] area). Please advise if viagra is ok, at what dose, and if you would be willing to prescribe the first prescription? Andree CossHowell, Fern Asmar M, RN

## 2018-07-28 NOTE — Telephone Encounter (Signed)
No problem for a month.  50 mgs.  Usually #10 is what is covered.  thanks

## 2018-07-29 MED ORDER — SILDENAFIL CITRATE 50 MG PO TABS: 50.0000 mg | ORAL_TABLET | ORAL | 0 refills | Status: DC | PRN

## 2018-07-29 NOTE — Addendum Note (Signed)
Addended by: Andree CossHOWELL, Cynthie Garmon M on: 07/29/2018 09:14 AM   Modules accepted: Orders

## 2018-07-29 NOTE — Telephone Encounter (Signed)
Sent in prescription, please cosign. Left voicemail for Mellody DanceKeith, notifying him of the prescription and asking him to go to [email or web address] to find a local primary care provider. Andree CossHowell, Sheneka Schrom M, RN

## 2018-07-30 ENCOUNTER — Telehealth: Payer: Self-pay

## 2018-07-30 NOTE — Telephone Encounter (Signed)
Patient called today with concerns regarding Viagra. Patient would like to know when and how to take his Viagra. Informed patient that he is to take his Viagra ONCE a day one hour before sexual activity. Patient asked if it would be okay to take Viagra more than once a day. Advised patient to refrain from doing so to avoid serious adverse reactions. Informed patient if he had any further questions regarding medication to reach out to local pharmacist who would be able to answer his questions.  Garrett Schroeder, [place]

## 2018-08-13 ENCOUNTER — Telehealth: Payer: Self-pay | Admitting: *Deleted

## 2018-08-13 MED FILL — FLUOCINONIDE 0.05 % SOLN: 0.05 | 30 days supply | Qty: 60 | Fill #0

## 2018-08-13 MED FILL — SYMTUZA 800-150-200-10 MG T: 800-150-200 | 30 days supply | Qty: 30 | Fill #1

## 2018-08-13 MED FILL — TIVICAY 50 MG TABLET: 50 | 30 days supply | Qty: 30 | Fill #1

## 2018-08-13 MED FILL — IBUPROFEN 800 MG TAB: 800 | 20 days supply | Qty: 60 | Fill #1

## 2018-08-13 NOTE — Telephone Encounter (Signed)
Contacted plan to get PA for Fluocinonide it was approved 08/13/18-08/08/19 and approval [id number]. Information sent to [facility] to fill.

## 2018-08-15 MED FILL — BETAMETHASONE VALERATE 0.1: 0.1 | 15 days supply | Qty: 30 | Fill #1

## 2018-08-20 ENCOUNTER — Other Ambulatory Visit: Payer: Medicaid Other

## 2018-08-20 DIAGNOSIS — B20 Human immunodeficiency virus [HIV] disease: Secondary | ICD-10-CM

## 2018-08-21 LAB — T-HELPER CELL (CD4) - (RCID CLINIC ONLY)
CD4 T CELL ABS: 370 /uL — AB (ref 400–2700)
CD4 T CELL HELPER: 24 % — AB (ref 33–55)

## 2018-08-23 LAB — COMPLETE METABOLIC PANEL WITH GFR
AG RATIO: 1.2 (calc) (ref 1.0–2.5)
ALT: 9 U/L (ref 9–46)
AST: 12 U/L (ref 10–40)
Albumin: 4.2 g/dL (ref 3.6–5.1)
Alkaline phosphatase (APISO): 61 U/L (ref 40–115)
BILIRUBIN TOTAL: 0.3 mg/dL (ref 0.2–1.2)
BUN/Creatinine Ratio: 14 (calc) (ref 6–22)
BUN: 19 mg/dL (ref 7–25)
CHLORIDE: 102 mmol/L (ref 98–110)
CO2: 27 mmol/L (ref 20–32)
Calcium: 9.9 mg/dL (ref 8.6–10.3)
Creat: 1.37 mg/dL — ABNORMAL HIGH (ref 0.60–1.35)
GFR, EST AFRICAN AMERICAN: 71 mL/min/{1.73_m2} (ref >=60)
GFR, EST NON AFRICAN AMERICAN: 61 mL/min/{1.73_m2} (ref >=60)
GLOBULIN: 3.4 g/dL (ref 1.9–3.7)
Glucose, Bld: 93 mg/dL (ref 65–99)
POTASSIUM: 4.1 mmol/L (ref 3.5–5.3)
SODIUM: 139 mmol/L (ref 135–146)
Total Protein: 7.6 g/dL (ref 6.1–8.1)

## 2018-08-23 LAB — HIV-1 RNA QUANT-NO REFLEX-BLD
HIV 1 RNA QUANT: 55 {copies}/mL — AB
HIV-1 RNA Quant, Log: 1.74 Log copies/mL — ABNORMAL HIGH

## 2018-08-23 LAB — CBC WITH DIFFERENTIAL/PLATELET
BASOS ABS: 40 {cells}/uL (ref 0–200)
Basophils Relative: 0.4 %
EOS ABS: 111 {cells}/uL (ref 15–500)
Eosinophils Relative: 1.1 %
HEMATOCRIT: 39.5 % (ref 38.5–50.0)
Hemoglobin: 13.6 g/dL (ref 13.2–17.1)
LYMPHS ABS: 1505 {cells}/uL (ref 850–3900)
MCH: 30.3 pg (ref 27.0–33.0)
MCHC: 34.4 g/dL (ref 32.0–36.0)
MCV: 88 fL (ref 80.0–100.0)
MPV: 9 fL (ref 7.5–12.5)
Monocytes Relative: 10.5 %
Neutro Abs: 7383 cells/uL (ref 1500–7800)
Neutrophils Relative %: 73.1 %
PLATELETS: 395 10*3/uL (ref 140–400)
RBC: 4.49 10*6/uL (ref 4.20–5.80)
RDW: 13.3 % (ref 11.0–15.0)
TOTAL LYMPHOCYTE: 14.9 %
WBC: 10.1 10*3/uL (ref 3.8–10.8)
WBCMIX: 1061 {cells}/uL — AB (ref 200–950)

## 2018-08-23 LAB — RPR: RPR Ser Ql: NONREACTIVE

## 2018-09-03 ENCOUNTER — Ambulatory Visit (INDEPENDENT_AMBULATORY_CARE_PROVIDER_SITE_OTHER): Payer: Medicaid Other | Admitting: Internal Medicine

## 2018-09-03 ENCOUNTER — Encounter: Payer: Self-pay | Admitting: Internal Medicine

## 2018-09-03 VITALS — BP 113/74 | HR 82 | Temp 98.6°F | Ht 65.0 in | Wt 139.0 lb

## 2018-09-03 DIAGNOSIS — Z113 Encounter for screening for infections with a predominantly sexual mode of transmission: Secondary | ICD-10-CM

## 2018-09-03 DIAGNOSIS — Z79899 Other long term (current) drug therapy: Secondary | ICD-10-CM

## 2018-09-03 DIAGNOSIS — F32A Depression, unspecified: Secondary | ICD-10-CM

## 2018-09-03 DIAGNOSIS — L4050 Arthropathic psoriasis, unspecified: Secondary | ICD-10-CM | POA: Diagnosis not present

## 2018-09-03 DIAGNOSIS — B20 Human immunodeficiency virus [HIV] disease: Secondary | ICD-10-CM

## 2018-09-03 DIAGNOSIS — Z23 Encounter for immunization: Secondary | ICD-10-CM

## 2018-09-03 DIAGNOSIS — F329 Major depressive disorder, single episode, unspecified: Secondary | ICD-10-CM

## 2018-09-03 NOTE — Progress Notes (Signed)
   Subjective:    Patient ID: Garrett Schroeder, male    DOB: 02-Apr-1971, 47 y.o.   MRN: [id number]  HPI Here for follow up of HIV Has been off and on treatment and intermittent care.  Came back in September interested to start back.  Started [place].  Viral load down from 14,900 to just 55 copies.  CD 4 up to 370.  No new issues with medication.  No associated n/v/d.  No rashes.  Referred to rheumatology for his psoriatic arthritis.   Now with a new girlfriend.  Plans to marry in 1 year.     Review of Systems  Constitutional: Negative for fatigue.  Gastrointestinal: Negative for diarrhea.  Skin: Positive for rash.  Neurological: Negative for dizziness.       Objective:   Physical Exam  Constitutional: He appears well-developed and well-nourished. No distress.  Eyes: No scleral icterus.  Cardiovascular: Normal rate, regular rhythm and normal heart sounds.  No murmur heard. Pulmonary/Chest: Effort normal and breath sounds normal. No respiratory distress.  Skin: Rash noted.   SH: some marijuana, decreased alcohol intake       Assessment & Plan:

## 2018-09-03 NOTE — Addendum Note (Signed)
Addended by: Gardiner Barefoot on: 09/03/2018 11:04 AM   Modules accepted: Orders

## 2018-09-03 NOTE — Assessment & Plan Note (Signed)
Discussed Prevnar and flu shot and given today

## 2018-09-03 NOTE — Assessment & Plan Note (Signed)
Referred, will check if it is completed

## 2018-09-03 NOTE — Assessment & Plan Note (Signed)
Doing well and no missed doses.  rtc 6 months 

## 2018-09-03 NOTE — Assessment & Plan Note (Signed)
Much improved mood with his new relationship.  No current issues.

## 2018-09-03 NOTE — Progress Notes (Signed)
Spoke to [employer]. Regarding recent referral to Rheumatology. [facility] at [phone]. Patient informed to call office and schedule appointment if he does not hear from office soon. Patient verbalized understanding.

## 2018-09-11 MED FILL — IBUPROFEN 800 MG TAB: 800 | 20 days supply | Qty: 60 | Fill #2

## 2018-09-11 MED FILL — TIVICAY 50 MG TABLET: 50 | 30 days supply | Qty: 30 | Fill #2

## 2018-09-11 MED FILL — SYMTUZA 800-150-200-10 MG T: 800-150-200 | 30 days supply | Qty: 30 | Fill #2

## 2018-09-11 MED FILL — BETAMETHASONE VALERATE 0.1: 0.1 | 15 days supply | Qty: 30 | Fill #2

## 2018-10-08 MED FILL — TIVICAY 50 MG TABLET: 50 | 30 days supply | Qty: 30 | Fill #3

## 2018-10-08 MED FILL — BETAMETHASONE VALERATE 0.1: 0.1 | 15 days supply | Qty: 30 | Fill #3

## 2018-10-08 MED FILL — IBUPROFEN 800 MG TAB: 800 | 20 days supply | Qty: 60 | Fill #3

## 2018-10-08 MED FILL — SYMTUZA 800-150-200-10 MG T: 800-150-200 | 30 days supply | Qty: 30 | Fill #3

## 2018-11-11 MED FILL — IBUPROFEN 800 MG TAB: 800 | 20 days supply | Qty: 60 | Fill #4

## 2018-11-11 MED FILL — SYMTUZA 800-150-200-10 MG T: 800-150-200 | 30 days supply | Qty: 30 | Fill #4

## 2018-11-11 MED FILL — TIVICAY 50 MG TABLET: 50 | 30 days supply | Qty: 30 | Fill #4

## 2018-11-11 MED FILL — BETAMETHASONE VALERATE 0.1: 0.1 | 15 days supply | Qty: 30 | Fill #4

## 2018-11-17 ENCOUNTER — Telehealth: Payer: Self-pay | Admitting: *Deleted

## 2018-11-17 ENCOUNTER — Other Ambulatory Visit: Payer: Self-pay | Admitting: Internal Medicine

## 2018-11-17 DIAGNOSIS — N529 Male erectile dysfunction, unspecified: Secondary | ICD-10-CM

## 2018-11-17 MED ORDER — VARENICLINE TARTRATE 0.5 MG X 11 & 1 MG X 42 PO MISC: ORAL | 0 refills | Status: DC

## 2018-11-17 MED ORDER — VARENICLINE TARTRATE 1 MG PO TABS: 1.0000 mg | ORAL_TABLET | Freq: Two times a day (BID) | ORAL | 1 refills | Status: DC

## 2018-11-17 MED ORDER — SILDENAFIL CITRATE 50 MG PO TABS: 50.0000 mg | ORAL_TABLET | ORAL | 3 refills | Status: DC | PRN

## 2018-11-17 NOTE — Telephone Encounter (Signed)
Patient was at clinic for Prep visit with partner and asked about getting Rx for Chantix sent to his pharmacy. He advised it worked for him in the past and he wants to restart. Advised him will ask the provider and let him know. Patient wants Rx sent to [facility].

## 2018-11-17 NOTE — Telephone Encounter (Signed)
I have sent it in.

## 2018-12-04 MED FILL — TIVICAY 50 MG TABLET: 50 | 30 days supply | Qty: 30 | Fill #5

## 2018-12-04 MED FILL — IBUPROFEN 800 MG TAB: 800 | 20 days supply | Qty: 60 | Fill #5

## 2018-12-04 MED FILL — SYMTUZA 800-150-200-10 MG T: 800-150-200 | 30 days supply | Qty: 30 | Fill #5

## 2018-12-26 ENCOUNTER — Other Ambulatory Visit: Payer: Self-pay | Admitting: Internal Medicine

## 2018-12-29 ENCOUNTER — Other Ambulatory Visit: Payer: Self-pay | Admitting: Internal Medicine

## 2018-12-29 MED FILL — IBUPROFEN 800 MG TAB: 800 | 20 days supply | Qty: 60 | Fill #0

## 2019-01-01 ENCOUNTER — Other Ambulatory Visit: Payer: Self-pay | Admitting: Internal Medicine

## 2019-01-01 DIAGNOSIS — B20 Human immunodeficiency virus [HIV] disease: Secondary | ICD-10-CM

## 2019-01-01 MED FILL — BETAMETHASONE VALERATE 0.1: 0.1 | 15 days supply | Qty: 30 | Fill #5

## 2019-01-01 MED FILL — TIVICAY 50 MG TABLET: 50 | 30 days supply | Qty: 30 | Fill #0

## 2019-01-01 MED FILL — SYMTUZA 800-150-200-10 MG T: 800-150-200 | 30 days supply | Qty: 30 | Fill #0

## 2019-01-24 ENCOUNTER — Telehealth: Payer: Medicaid Other | Admitting: Nurse Practitioner

## 2019-01-24 DIAGNOSIS — R059 Cough, unspecified: Secondary | ICD-10-CM

## 2019-01-24 DIAGNOSIS — R05 Cough: Secondary | ICD-10-CM

## 2019-01-24 MED ORDER — BENZONATATE 100 MG PO CAPS: 100.0000 mg | ORAL_CAPSULE | Freq: Three times a day (TID) | ORAL | 0 refills | Status: DC | PRN

## 2019-01-24 NOTE — Progress Notes (Signed)
E-Visit for Corona Virus Screening  Based on your current symptoms, it seems unlikely that your symptoms are related to the Coronavirus.   * [facility] is currently only testing those that have severe symptoms anyway.  Coronavirus disease 2019 (COVID-19) is a respiratory illness that can spread from person to person. The virus that causes COVID-19 is a new virus that was first identified in the country of [place] but is now found in multiple other countries and has spread to the [place].  Symptoms associated with the virus are mild to severe fever, cough, and shortness of breath. There is currently no vaccine to protect against COVID-19, and there is no specific antiviral treatment for the virus.   To be considered HIGH RISK for Coronavirus (COVID-19), you have to meet the following criteria:  . Traveled to [place], [place], [place], [place] or [place]; or in the [place] to [place], [place], [place], or [place]; and have fever, cough, and shortness of breath within the last 2 weeks of travel OR  . Been in close contact with a person diagnosed with COVID-19 within the last 2 weeks and have fever, cough, and shortness of breath  . IF YOU DO NOT MEET THESE CRITERIA, YOU ARE CONSIDERED LOW RISK FOR COVID-19.   It is vitally important that if you feel that you have an infection such as this virus or any other virus that you stay home and away from places where you may spread it to others.  You should self-quarantine for 14 days if you have symptoms that could potentially be coronavirus and avoid contact with people age 52 and older.   You can use medication such as A prescription cough medication called Tessalon Perles 100 mg. You may take 1-2 capsules every 8 hours as needed for cough  You may also take acetaminophen (Tylenol) as needed for fever.   Reduce your risk of any infection by using the same precautions used for avoiding the common cold or flu:  Marland Kitchen Wash your hands often with soap  and warm water for at least 20 seconds.  If soap and water are not readily available, use an alcohol-based hand sanitizer with at least 60% alcohol.  . If coughing or sneezing, cover your mouth and nose by coughing or sneezing into the elbow areas of your shirt or coat, into a tissue or into your sleeve (not your hands). . Avoid shaking hands with others and consider head nods or verbal greetings only. . Avoid touching your eyes, nose, or mouth with unwashed hands.  . Avoid close contact with people who are sick. . Avoid places or events with large numbers of people in one location, like concerts or sporting events. . Carefully consider travel plans you have or are making. . If you are planning any travel outside or inside the [place], visit the CDC's Travelers' Health webpage for the latest health notices. . If you have some symptoms but not all symptoms, continue to monitor at home and seek medical attention if your symptoms worsen. . If you are having a medical emergency, call 911.  HOME CARE . Only take medications as instructed by your medical team. . Drink plenty of fluids and get plenty of rest. . A steam or ultrasonic humidifier can help if you have congestion.   GET HELP RIGHT AWAY IF: . You develop worsening fever. . You become short of breath . You cough up blood. . Your symptoms become more severe MAKE SURE YOU  Understand these instructions.  Will watch your condition.  Will get help right away if you are not doing well or get worse.  Your e-visit answers were reviewed by a board certified advanced clinical practitioner to complete your personal care plan.  Depending on the condition, your plan could have included both over the counter or prescription medications.  If there is a problem please reply once you have received a response from your provider. Your safety is important to [place].  If you have drug allergies check your prescription carefully.    You can use MyChart to ask  questions about today's visit, request a non-urgent call back, or ask for a work or school excuse for 24 hours related to this e-Visit. If it has been greater than 24 hours you will need to follow up with your provider, or enter a new e-Visit to address those concerns. You will get an e-mail in the next two days asking about your experience.  I hope that your e-visit has been valuable and will speed your recovery. Thank you for using e-visits.   5 minutes spent reviewing and documenting in chart.

## 2019-01-25 ENCOUNTER — Telehealth: Payer: Medicaid Other | Admitting: Family

## 2019-01-25 DIAGNOSIS — R059 Cough, unspecified: Secondary | ICD-10-CM

## 2019-01-25 DIAGNOSIS — R05 Cough: Secondary | ICD-10-CM

## 2019-01-25 DIAGNOSIS — R6889 Other general symptoms and signs: Secondary | ICD-10-CM

## 2019-01-25 DIAGNOSIS — B20 Human immunodeficiency virus [HIV] disease: Secondary | ICD-10-CM

## 2019-01-25 NOTE — Progress Notes (Signed)
E-Visit for Corona Virus Screening  Based on your current symptoms, you may very well have the virus, however your symptoms are mild. Currently, not all patients are being tested. If the symptoms are mild and there is not a known exposure, performing the test is not indicated.   As discussed on the phone, since your SOB is stable we will continue monitor.  We are no longer testing anyone at this time. 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 I would recommend that you be seen face to face if your symptoms worsen, as this can turn into pneumonia. I do think it would be best to try to avoid the ED unless your symptoms of shortness of breathe worsen.   Approximately 5 minutes was spent documenting and reviewing patient's chart.    Coronavirus disease 2019 (COVID-19) is a respiratory illness that can spread from person to person. The virus that causes COVID-19 is a new virus that was first identified in the country of [place] but is now found in multiple other countries and has spread to the [place].  Symptoms associated with the virus are mild to severe fever, cough, and shortness of breath. There is currently no vaccine to protect against COVID-19, and there is no specific antiviral treatment for the virus.   To be considered HIGH RISK for Coronavirus (COVID-19), you have to meet the following criteria:  . Traveled to [place], [place], [place], [place] or [place]; or in the [place] to [place], [place], [place], or [place]; and have fever, cough, and shortness of breath within the last 2 weeks of travel OR  . Been in close contact with a person diagnosed with COVID-19 within the last 2 weeks and have fever, cough, and shortness of breath  . IF YOU DO NOT MEET THESE CRITERIA, YOU ARE CONSIDERED LOW RISK FOR COVID-19.   It is vitally important that if you feel that you have an infection such as this virus or any other virus that you stay  home and away from places where you may spread it to others.  You should self-quarantine for 14 days if you have symptoms that could potentially be coronavirus and avoid contact with people age 91 and older.   You may also take acetaminophen (Tylenol) as needed for fever.   Reduce your risk of any infection by using the same precautions used for avoiding the common cold or flu:  Marland Kitchen Wash your hands often with soap and warm water for at least 20 seconds.  If soap and water are not readily available, use an alcohol-based hand sanitizer with at least 60% alcohol.  . If coughing or sneezing, cover your mouth and nose by coughing or sneezing into the elbow areas of your shirt or coat, into a tissue or into your sleeve (not your hands). . Avoid shaking hands with others and consider head nods or verbal greetings only. . Avoid touching your eyes, nose, or mouth with unwashed hands.  . Avoid close contact with people who are sick. . Avoid places or events with large numbers of people in one location, like concerts or sporting events. . Carefully consider travel plans you have or are making. . If you are planning any travel outside or inside the [place], visit the CDC's Travelers' Health webpage for the latest health notices. . If you have some symptoms but not all symptoms, continue to monitor at home and seek medical attention if your  symptoms worsen. . If you are having a medical emergency, call 911.  HOME CARE . Only take medications as instructed by your medical team. . Drink plenty of fluids and get plenty of rest. . A steam or ultrasonic humidifier can help if you have congestion.   GET HELP RIGHT AWAY IF: . You develop worsening fever. . You become short of breath . You cough up blood. . Your symptoms become more severe MAKE SURE YOU   Understand these instructions.  Will watch your condition.  Will get help right away if you are not doing well or get worse.  Your e-visit answers were  reviewed by a board certified advanced clinical practitioner to complete your personal care plan.  Depending on the condition, your plan could have included both over the counter or prescription medications.  If there is a problem please reply once you have received a response from your provider. Your safety is important to [place].  If you have drug allergies check your prescription carefully.    You can use MyChart to ask questions about today's visit, request a non-urgent call back, or ask for a work or school excuse for 24 hours related to this e-Visit. If it has been greater than 24 hours you will need to follow up with your provider, or enter a new e-Visit to address those concerns. You will get an e-mail in the next two days asking about your experience.  I hope that your e-visit has been valuable and will speed your recovery. Thank you for using e-visits.

## 2019-01-26 MED FILL — BENZONATATE 100 MG CAP: 100 | 7 days supply | Qty: 20 | Fill #0

## 2019-02-02 ENCOUNTER — Other Ambulatory Visit: Payer: Medicaid Other

## 2019-02-02 ENCOUNTER — Other Ambulatory Visit: Payer: Self-pay

## 2019-02-02 DIAGNOSIS — Z113 Encounter for screening for infections with a predominantly sexual mode of transmission: Secondary | ICD-10-CM

## 2019-02-02 DIAGNOSIS — Z79899 Other long term (current) drug therapy: Secondary | ICD-10-CM

## 2019-02-02 DIAGNOSIS — B20 Human immunodeficiency virus [HIV] disease: Secondary | ICD-10-CM

## 2019-02-03 LAB — T-HELPER CELL (CD4) - (RCID CLINIC ONLY)
CD4 % Helper T Cell: 32 % — ABNORMAL LOW (ref 33–55)
CD4 T Cell Abs: 420 /uL (ref 400–2700)

## 2019-02-06 ENCOUNTER — Other Ambulatory Visit: Payer: Medicaid Other

## 2019-02-09 ENCOUNTER — Other Ambulatory Visit: Payer: Self-pay | Admitting: Internal Medicine

## 2019-02-09 DIAGNOSIS — L409 Psoriasis, unspecified: Secondary | ICD-10-CM

## 2019-02-09 MED FILL — SYMTUZA 800-150-200-10 MG T: 800-150-200 | 30 days supply | Qty: 30 | Fill #1

## 2019-02-09 MED FILL — FLUOCINONIDE 0.05 % SOLN: 0.05 | 30 days supply | Qty: 60 | Fill #1

## 2019-02-09 MED FILL — TIVICAY 50 MG TABLET: 50 | 30 days supply | Qty: 30 | Fill #1

## 2019-02-09 MED FILL — BETAMETHASONE VALERATE 0.1: 0.1 | 25 days supply | Qty: 30 | Fill #0

## 2019-02-09 MED FILL — IBUPROFEN 800 MG TAB: 800 | 20 days supply | Qty: 60 | Fill #1

## 2019-02-11 ENCOUNTER — Other Ambulatory Visit: Payer: Medicaid Other

## 2019-02-11 LAB — RPR: RPR: NONREACTIVE

## 2019-02-11 LAB — CBC WITH DIFFERENTIAL/PLATELET
Absolute Monocytes: 936 cells/uL (ref 200–950)
Basophils Absolute: 47 cells/uL (ref 0–200)
Basophils Relative: 0.6 %
Eosinophils Absolute: 265 cells/uL (ref 15–500)
Eosinophils Relative: 3.4 %
HCT: 41.2 % (ref 38.5–50.0)
Hemoglobin: 14.4 g/dL (ref 13.2–17.1)
LYMPHS ABS: 1295 {cells}/uL (ref 850–3900)
MCH: 32.8 pg (ref 27.0–33.0)
MCHC: 35 g/dL (ref 32.0–36.0)
MCV: 93.8 fL (ref 80.0–100.0)
MPV: 9.3 fL (ref 7.5–12.5)
Monocytes Relative: 12 %
NEUTROS ABS: 5257 {cells}/uL (ref 1500–7800)
Neutrophils Relative %: 67.4 %
Platelets: 315 10*3/uL (ref 140–400)
RBC: 4.39 10*6/uL (ref 4.20–5.80)
RDW: 13.9 % (ref 11.0–15.0)
Total Lymphocyte: 16.6 %
WBC: 7.8 10*3/uL (ref 3.8–10.8)

## 2019-02-11 LAB — COMPLETE METABOLIC PANEL WITH GFR
AG Ratio: 1.4 (calc) (ref 1.0–2.5)
ALT: 14 U/L (ref 9–46)
AST: 20 U/L (ref 10–40)
Albumin: 4.3 g/dL (ref 3.6–5.1)
Alkaline phosphatase (APISO): 70 U/L (ref 36–130)
BUN: 22 mg/dL (ref 7–25)
CO2: 27 mmol/L (ref 20–32)
Calcium: 9.7 mg/dL (ref 8.6–10.3)
Chloride: 105 mmol/L (ref 98–110)
Creat: 1.11 mg/dL (ref 0.60–1.35)
GFR, Est African American: 92 mL/min/{1.73_m2} (ref >=60)
GFR, Est Non African American: 79 mL/min/{1.73_m2} (ref >=60)
GLOBULIN: 3 g/dL (ref 1.9–3.7)
Glucose, Bld: 93 mg/dL (ref 65–99)
Potassium: 4.7 mmol/L (ref 3.5–5.3)
Sodium: 142 mmol/L (ref 135–146)
Total Bilirubin: 0.4 mg/dL (ref 0.2–1.2)
Total Protein: 7.3 g/dL (ref 6.1–8.1)

## 2019-02-11 LAB — HIV-1 RNA QUANT-NO REFLEX-BLD
HIV 1 RNA Quant: <20 copies/mL
HIV-1 RNA Quant, Log: <1.3 Log copies/mL

## 2019-02-11 LAB — LIPID PANEL
Cholesterol: 253 mg/dL — ABNORMAL HIGH (ref <200)
HDL: 44 mg/dL (ref >=40)
LDL Cholesterol (Calc): 161 mg/dL (calc) — ABNORMAL HIGH
Non-HDL Cholesterol (Calc): 209 mg/dL (calc) — ABNORMAL HIGH (ref <130)
Total CHOL/HDL Ratio: 5.8 (calc) — ABNORMAL HIGH (ref <5.0)
Triglycerides: 315 mg/dL — ABNORMAL HIGH (ref <150)

## 2019-02-17 ENCOUNTER — Other Ambulatory Visit: Payer: Self-pay | Admitting: Behavioral Health

## 2019-02-17 ENCOUNTER — Telehealth: Payer: Self-pay | Admitting: Pharmacist

## 2019-02-17 ENCOUNTER — Other Ambulatory Visit: Payer: Self-pay | Admitting: Internal Medicine

## 2019-02-17 ENCOUNTER — Telehealth: Payer: Self-pay | Admitting: Behavioral Health

## 2019-02-17 MED ORDER — BUPROPION HCL ER (SR) 150 MG PO TB12: 150.0000 mg | ORAL_TABLET | Freq: Two times a day (BID) | ORAL | 2 refills | Status: DC

## 2019-02-17 NOTE — Telephone Encounter (Signed)
Patient called stating he would like to stop smoking.  He states he tried Chantix starting in January but states it did not work.  He was wondering if there was something else he could try.  He states he took the Chantix as prescribed. Angeline Slim RN

## 2019-02-17 NOTE — Telephone Encounter (Signed)
Patient and patient's girlfriend called to discuss his recent lab results.  I went through all of his results with them. Congratulated him on being undetectable and having a healthy CD4 count. His girlfriend, Garrett Schroeder, states that she makes sure he takes his medications every morning.  I also discussed his cholesterol levels and she said that they were going to try changing their diet and that he wasn't fasting when they drew blood.  He used to take Lipitor.  He does have a PCP appt in June and will see [name] in May.   I told them that he will probably eventually need to go back on Lipitor as his triglycerides, LDL, and total cholesterol were all high.   I also explained Wellbutrin to them.  His gf was concerned that it was an antidepressant and wondered why it was prescribed for smoking cessation.  I explained it's dual purposes and they were satisfied.

## 2019-02-17 NOTE — Telephone Encounter (Signed)
The other option is Wellbutrin.  I will send it in.

## 2019-02-17 NOTE — Telephone Encounter (Signed)
Called patient and informed him that Wellbutrin was called in to the [facility].  Patient verbalized understanding. Angeline Slim RN

## 2019-03-05 ENCOUNTER — Other Ambulatory Visit: Payer: Medicaid Other

## 2019-03-09 MED FILL — IBUPROFEN 800 MG TAB: 800 | 20 days supply | Qty: 60 | Fill #2

## 2019-03-09 MED FILL — TIVICAY 50 MG TABLET: 50 | 30 days supply | Qty: 30 | Fill #2

## 2019-03-09 MED FILL — SYMTUZA 800-150-200-10 MG T: 800-150-200 | 30 days supply | Qty: 30 | Fill #2

## 2019-03-23 ENCOUNTER — Encounter: Payer: Self-pay | Admitting: Internal Medicine

## 2019-03-23 ENCOUNTER — Other Ambulatory Visit: Payer: Self-pay

## 2019-03-23 ENCOUNTER — Ambulatory Visit (INDEPENDENT_AMBULATORY_CARE_PROVIDER_SITE_OTHER): Payer: Medicaid Other | Admitting: Internal Medicine

## 2019-03-23 VITALS — BP 101/67 | HR 83 | Temp 98.0°F | Ht 68.0 in | Wt 143.0 lb

## 2019-03-23 DIAGNOSIS — L4050 Arthropathic psoriasis, unspecified: Secondary | ICD-10-CM

## 2019-03-23 DIAGNOSIS — B20 Human immunodeficiency virus [HIV] disease: Secondary | ICD-10-CM | POA: Diagnosis not present

## 2019-03-23 DIAGNOSIS — Z79899 Other long term (current) drug therapy: Secondary | ICD-10-CM | POA: Diagnosis not present

## 2019-03-23 DIAGNOSIS — N529 Male erectile dysfunction, unspecified: Secondary | ICD-10-CM

## 2019-03-23 DIAGNOSIS — F17200 Nicotine dependence, unspecified, uncomplicated: Secondary | ICD-10-CM

## 2019-03-23 MED ORDER — SILDENAFIL CITRATE 50 MG PO TABS: 50.0000 mg | ORAL_TABLET | ORAL | 3 refills | Status: DC | PRN

## 2019-03-23 NOTE — Progress Notes (Signed)
   Subjective:    Patient ID: Garrett Schroeder, male    DOB: 06-28-1971, 48 y.o.   MRN: [id number]  HPI Here for follow up of HIV Has been off and on treatment and intermittent care.  Came back in September 2019 interested to start back.  Started [place] and [place].  Viral load down from 14,900 to just 55 copies.  CD 4 up to 370.  Has been in a new relationship and now engaged.  Plans to marry this year.  Fiance here at appointment with him.  He is now completely compliant with medication.  CD4 420 and viral load < 20.  She is on PrEP here as well.  Asking about his blood type.  Took Wellbutrin for smoking cessation but did not help.  Chantix previously did not help.  Has an appt with a new PCP in June.   Review of Systems  Constitutional: Negative for fatigue.  Gastrointestinal: Negative for diarrhea.  Neurological: Negative for dizziness.       Objective:   Physical Exam Constitutional:      General: He is not in acute distress.    Appearance: He is well-developed.  Eyes:     General: No scleral icterus. Cardiovascular:     Rate and Rhythm: Normal rate and regular rhythm.     Heart sounds: Normal heart sounds. No murmur.  Pulmonary:     Effort: Pulmonary effort is normal. No respiratory distress.     Breath sounds: Normal breath sounds.    SH: some marijuana, decreased alcohol intake; continues to smoke but trying to quit.        Assessment & Plan:

## 2019-03-23 NOTE — Assessment & Plan Note (Signed)
Had been referred to rheumatology again but did not set up.  Will defer to his new PCP for management

## 2019-03-23 NOTE — Assessment & Plan Note (Signed)
Cholesterol up - will defer to PCP for management.

## 2019-03-23 NOTE — Assessment & Plan Note (Signed)
Encouraged cessation despite difficulty.

## 2019-03-23 NOTE — Assessment & Plan Note (Signed)
Doing great and now in a good place to remain compliant.   rtc 6 months

## 2019-04-04 ENCOUNTER — Other Ambulatory Visit: Payer: Self-pay | Admitting: Internal Medicine

## 2019-04-04 DIAGNOSIS — B20 Human immunodeficiency virus [HIV] disease: Secondary | ICD-10-CM

## 2019-04-08 MED FILL — BETAMETHASONE VA 0.1% CREAM: 0.1 | 25 days supply | Qty: 30 | Fill #1

## 2019-04-08 MED FILL — TIVICAY 50 MG TABLET: 50 | 30 days supply | Qty: 30 | Fill #0

## 2019-04-08 MED FILL — IBUPROFEN 800 MG TAB: 800 | 20 days supply | Qty: 60 | Fill #0

## 2019-04-08 MED FILL — SYMTUZA 800-150-200-10 MG T: 800-150-200 | 30 days supply | Qty: 30 | Fill #0

## 2019-05-07 MED FILL — IBUPROFEN 800 MG TAB: 800 | 20 days supply | Qty: 60 | Fill #1

## 2019-05-07 MED FILL — SYMTUZA 800-150-200-10 MG T: 800-150-200 | 30 days supply | Qty: 30 | Fill #1

## 2019-05-07 MED FILL — TIVICAY 50 MG TABLET: 50 | 30 days supply | Qty: 30 | Fill #1

## 2019-05-14 ENCOUNTER — Ambulatory Visit (INDEPENDENT_AMBULATORY_CARE_PROVIDER_SITE_OTHER): Payer: Medicaid Other

## 2019-05-14 ENCOUNTER — Telehealth: Payer: Self-pay | Admitting: Orthopaedic Surgery

## 2019-05-14 ENCOUNTER — Encounter: Payer: Self-pay | Admitting: Orthopaedic Surgery

## 2019-05-14 ENCOUNTER — Ambulatory Visit (INDEPENDENT_AMBULATORY_CARE_PROVIDER_SITE_OTHER): Payer: Medicaid Other | Admitting: Orthopaedic Surgery

## 2019-05-14 ENCOUNTER — Other Ambulatory Visit: Payer: Self-pay

## 2019-05-14 VITALS — Ht 65.5 in | Wt 149.0 lb

## 2019-05-14 DIAGNOSIS — M795 Residual foreign body in soft tissue: Secondary | ICD-10-CM

## 2019-05-14 DIAGNOSIS — M5412 Radiculopathy, cervical region: Secondary | ICD-10-CM

## 2019-05-14 MED ORDER — PREDNISONE 10 MG (21) PO TBPK: ORAL_TABLET | ORAL | 0 refills | Status: DC

## 2019-05-14 MED ORDER — METHOCARBAMOL 500 MG PO TABS: 500.0000 mg | ORAL_TABLET | Freq: Two times a day (BID) | ORAL | 0 refills | Status: DC | PRN
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
## 2019-05-14 NOTE — Telephone Encounter (Signed)
Patient's [employer] called asked if the patient can be called letting him know how long will he be out of work after surgery. The number to contact patient is  [phone]

## 2019-05-14 NOTE — Telephone Encounter (Signed)
1 week 

## 2019-05-14 NOTE — Telephone Encounter (Signed)
Please advise 

## 2019-05-15 NOTE — Telephone Encounter (Signed)
I left voicemail for patient advising. 

## 2019-05-21 ENCOUNTER — Other Ambulatory Visit: Payer: Self-pay

## 2019-05-21 ENCOUNTER — Encounter: Payer: Self-pay | Admitting: Physical Therapy

## 2019-05-21 ENCOUNTER — Ambulatory Visit: Payer: Medicaid Other | Attending: Physician Assistant | Admitting: Physical Therapy

## 2019-05-21 DIAGNOSIS — M5412 Radiculopathy, cervical region: Secondary | ICD-10-CM | POA: Insufficient documentation
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
## 2019-05-29 ENCOUNTER — Ambulatory Visit: Payer: Medicaid Other | Admitting: Physical Therapy

## 2019-05-29 ENCOUNTER — Encounter: Payer: Self-pay | Admitting: Physical Therapy

## 2019-05-29 ENCOUNTER — Other Ambulatory Visit: Payer: Self-pay

## 2019-05-29 DIAGNOSIS — M5412 Radiculopathy, cervical region: Secondary | ICD-10-CM | POA: Diagnosis not present
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
## 2019-06-02 ENCOUNTER — Other Ambulatory Visit: Payer: Self-pay | Admitting: *Deleted

## 2019-06-02 DIAGNOSIS — N529 Male erectile dysfunction, unspecified: Secondary | ICD-10-CM

## 2019-06-02 MED ORDER — SILDENAFIL CITRATE 50 MG PO TABS: 50.0000 mg | ORAL_TABLET | ORAL | 3 refills | Status: DC | PRN

## 2019-06-04 ENCOUNTER — Ambulatory Visit: Payer: Medicaid Other | Admitting: Physical Therapy

## 2019-06-04 MED FILL — IBUPROFEN 800 MG TAB: 800 | 20 days supply | Qty: 60 | Fill #2

## 2019-06-04 MED FILL — TIVICAY 50 MG TABLET: 50 | 30 days supply | Qty: 30 | Fill #2

## 2019-06-04 MED FILL — BETAMETHASONE VALERATE 0.1: 0.1 | 25 days supply | Qty: 30 | Fill #2

## 2019-06-04 MED FILL — SYMTUZA 800-150-200-10 MG T: 800-150-200 | 30 days supply | Qty: 30 | Fill #2

## 2019-06-09 ENCOUNTER — Ambulatory Visit: Payer: Medicaid Other | Attending: Physician Assistant | Admitting: Physical Therapy

## 2019-06-17 ENCOUNTER — Ambulatory Visit: Payer: Medicaid Other | Admitting: Physical Therapy

## 2019-06-29 ENCOUNTER — Other Ambulatory Visit: Payer: Self-pay | Admitting: Internal Medicine

## 2019-07-02 MED FILL — TIVICAY 50 MG TABLET: 50 | 30 days supply | Qty: 30 | Fill #3

## 2019-07-02 MED FILL — BETAMETHASONE VALERATE 0.1: 0.1 | 25 days supply | Qty: 30 | Fill #3

## 2019-07-02 MED FILL — SYMTUZA 800-150-200-10 MG T: 800-150-200 | 30 days supply | Qty: 30 | Fill #3

## 2019-07-24 ENCOUNTER — Other Ambulatory Visit: Payer: Self-pay | Admitting: Internal Medicine

## 2019-08-05 MED FILL — TIVICAY 50 MG TABLET: 50 | 30 days supply | Qty: 30 | Fill #4

## 2019-08-05 MED FILL — SYMTUZA 800-150-200-10 MG T: 800-150-200 | 30 days supply | Qty: 30 | Fill #4

## 2019-09-02 MED FILL — BETAMETHASONE VALERATE 0.1: 0.1 | 25 days supply | Qty: 30 | Fill #4

## 2019-09-02 MED FILL — SYMTUZA 800-150-200-10 MG T: 800-150-200 | 30 days supply | Qty: 30 | Fill #5

## 2019-09-02 MED FILL — TIVICAY 50 MG TABLET: 50 | 30 days supply | Qty: 30 | Fill #5

## 2019-09-07 ENCOUNTER — Telehealth: Payer: Self-pay | Admitting: *Deleted

## 2019-09-07 NOTE — Telephone Encounter (Signed)
Patient and girlfriend called to report that he is having sever joint pain and would like to have the 800 mg Ibuprofen refilled. He also reports that his psoriasis is flaring and the cream only goes so far and wants something else for this. Advised them that they should call the orthopedic office about the joint pain and reminded them that we made the referral to rheumatology to take care of the psoriasis and he did not go. Advised I can ask the provider and get back to them as soon as he responds.

## 2019-09-08 NOTE — Telephone Encounter (Signed)
Garrett Schroeder called patient to advise he will need to contact his PCP for non HIV related issues and he states he work to much to go to the doctor. Advised him will make a note but he should contact his PCP or rheumatologist for further assistance.

## 2019-09-08 NOTE — Telephone Encounter (Signed)
He will need to talk to his PCP about non-HIV issues.  Thanks

## 2019-09-23 ENCOUNTER — Other Ambulatory Visit: Payer: Self-pay

## 2019-09-23 ENCOUNTER — Other Ambulatory Visit: Payer: Medicaid Other

## 2019-09-23 DIAGNOSIS — B20 Human immunodeficiency virus [HIV] disease: Secondary | ICD-10-CM

## 2019-09-24 LAB — T-HELPER CELL (CD4) - (RCID CLINIC ONLY)
CD4 % Helper T Cell: 36 % (ref 33–65)
CD4 T Cell Abs: 531 /uL (ref 400–1790)

## 2019-09-25 ENCOUNTER — Other Ambulatory Visit: Payer: Self-pay | Admitting: Internal Medicine

## 2019-09-25 DIAGNOSIS — B20 Human immunodeficiency virus [HIV] disease: Secondary | ICD-10-CM

## 2019-09-28 MED FILL — SYMTUZA 800-150-200-10 MG T: 800-150-200 | 30 days supply | Qty: 30 | Fill #0

## 2019-09-28 MED FILL — BETAMETHASONE VALERATE 0.1: 0.1 | 25 days supply | Qty: 30 | Fill #5

## 2019-09-28 MED FILL — TIVICAY 50 MG TABLET: 50 | 30 days supply | Qty: 30 | Fill #0

## 2019-09-30 LAB — HIV-1 RNA QUANT-NO REFLEX-BLD
HIV 1 RNA Quant: <20 copies/mL
HIV-1 RNA Quant, Log: <1.3 Log copies/mL

## 2019-10-07 ENCOUNTER — Encounter: Payer: Medicaid Other | Admitting: Internal Medicine

## 2019-10-19 ENCOUNTER — Encounter: Payer: Medicaid Other | Admitting: Internal Medicine

## 2019-10-21 ENCOUNTER — Other Ambulatory Visit: Payer: Self-pay | Admitting: Internal Medicine

## 2019-10-21 ENCOUNTER — Telehealth: Payer: Self-pay | Admitting: *Deleted

## 2019-10-21 DIAGNOSIS — Z79899 Other long term (current) drug therapy: Secondary | ICD-10-CM

## 2019-10-21 DIAGNOSIS — Z113 Encounter for screening for infections with a predominantly sexual mode of transmission: Secondary | ICD-10-CM

## 2019-10-21 DIAGNOSIS — B20 Human immunodeficiency virus [HIV] disease: Secondary | ICD-10-CM

## 2019-10-21 NOTE — Telephone Encounter (Signed)
Patient called.  He was on his honeymoon, missed his appointment here 12/14. He knows his lab results already, is pleased that he is undetectable with CD4 of 531, states his wife is taking good care of him and reminding him to take his medication daily.  He has a 6 month supply of tivicay/symtuza at [facility], will make plans to get his flu shot at a pharmacy locally.   Please advise when he should come back for labs/follow up. Landis Gandy, RN

## 2019-10-21 NOTE — Telephone Encounter (Signed)
Follow up in 6 months. Glad to hear  Lab orders placed for 6 months.

## 2019-10-22 MED FILL — TIVICAY 50 MG TABLET: 50 | 30 days supply | Qty: 30 | Fill #1

## 2019-10-22 MED FILL — SYMTUZA 800-150-200-10 MG T: 800-150-200 | 30 days supply | Qty: 30 | Fill #1

## 2019-11-16 ENCOUNTER — Other Ambulatory Visit: Payer: Self-pay | Admitting: Internal Medicine

## 2019-11-16 DIAGNOSIS — L409 Psoriasis, unspecified: Secondary | ICD-10-CM

## 2019-11-16 MED FILL — BETAMETHASONE VALERATE 0.1: 0.1 | 25 days supply | Qty: 30 | Fill #0

## 2019-12-08 MED FILL — BETAMETHASONE VALERATE 0.1: 0.1 | 25 days supply | Qty: 30 | Fill #1

## 2019-12-08 MED FILL — SYMTUZA 800-150-200-10 MG T: 800-150-200 | 30 days supply | Qty: 30 | Fill #2

## 2019-12-08 MED FILL — TIVICAY 50 MG TABLET: 50 | 30 days supply | Qty: 30 | Fill #2

## 2020-01-01 MED FILL — TIVICAY 50 MG TABLET: 50 | 30 days supply | Qty: 30 | Fill #3

## 2020-01-01 MED FILL — BETAMETHASONE VALERATE 0.1: 0.1 | 25 days supply | Qty: 30 | Fill #2

## 2020-01-01 MED FILL — SYMTUZA 800-150-200-10 MG T: 800-150-200 | 30 days supply | Qty: 30 | Fill #3

## 2020-01-13 ENCOUNTER — Encounter (HOSPITAL_COMMUNITY): Payer: Self-pay | Admitting: Emergency Medicine

## 2020-01-13 ENCOUNTER — Other Ambulatory Visit: Payer: Self-pay

## 2020-01-13 ENCOUNTER — Emergency Department (HOSPITAL_COMMUNITY)
Admission: EM | Admit: 2020-01-13 | Discharge: 2020-01-13 | Discharge status: Against Medical Advice | Payer: Medicaid Other | Attending: Emergency Medicine | Admitting: Emergency Medicine

## 2020-01-13 ENCOUNTER — Telehealth: Payer: Self-pay

## 2020-01-13 ENCOUNTER — Emergency Department (HOSPITAL_COMMUNITY): Payer: Medicaid Other

## 2020-01-13 DIAGNOSIS — Z79899 Other long term (current) drug therapy: Secondary | ICD-10-CM | POA: Diagnosis not present

## 2020-01-13 DIAGNOSIS — B20 Human immunodeficiency virus [HIV] disease: Secondary | ICD-10-CM | POA: Insufficient documentation

## 2020-01-13 DIAGNOSIS — F17210 Nicotine dependence, cigarettes, uncomplicated: Secondary | ICD-10-CM | POA: Insufficient documentation

## 2020-01-13 DIAGNOSIS — R0789 Other chest pain: Secondary | ICD-10-CM | POA: Insufficient documentation

## 2020-01-13 LAB — URINALYSIS, ROUTINE W REFLEX MICROSCOPIC
Bacteria, UA: NONE SEEN
Bilirubin Urine: NEGATIVE
Glucose, UA: 150 mg/dL — AB
Hgb urine dipstick: NEGATIVE
Ketones, ur: NEGATIVE mg/dL
Leukocytes,Ua: NEGATIVE
Nitrite: NEGATIVE
Protein, ur: 30 mg/dL — AB
Specific Gravity, Urine: 1.026 (ref 1.005–1.030)
pH: 5 (ref 5.0–8.0)

## 2020-01-13 LAB — BASIC METABOLIC PANEL
Anion gap: 11 (ref 5–15)
BUN: 20 mg/dL (ref 6–20)
CO2: 25 mmol/L (ref 22–32)
Calcium: 9.7 mg/dL (ref 8.9–10.3)
Chloride: 102 mmol/L (ref 98–111)
Creatinine, Ser: 1.11 mg/dL (ref 0.61–1.24)
GFR calc Af Amer: >60 mL/min (ref >60)
GFR calc non Af Amer: >60 mL/min (ref >60)
Glucose, Bld: 214 mg/dL — ABNORMAL HIGH (ref 70–99)
Potassium: 3.9 mmol/L (ref 3.5–5.1)
Sodium: 138 mmol/L (ref 135–145)

## 2020-01-13 LAB — CBC
HCT: 43.5 % (ref 39.0–52.0)
Hemoglobin: 15 g/dL (ref 13.0–17.0)
MCH: 33.8 pg (ref 26.0–34.0)
MCHC: 34.5 g/dL (ref 30.0–36.0)
MCV: 98 fL (ref 80.0–100.0)
Platelets: 290 10*3/uL (ref 150–400)
RBC: 4.44 MIL/uL (ref 4.22–5.81)
RDW: 13.6 % (ref 11.5–15.5)
WBC: 9.4 10*3/uL (ref 4.0–10.5)
nRBC: 0 % (ref 0.0–0.2)

## 2020-01-13 LAB — TROPONIN I (HIGH SENSITIVITY)
Troponin I (High Sensitivity): 4 ng/L (ref <18)
Troponin I (High Sensitivity): 6 ng/L (ref <18)

## 2020-01-13 MED ORDER — KETOROLAC TROMETHAMINE 15 MG/ML IJ SOLN
15.0000 mg | Freq: Once | INTRAMUSCULAR | Status: DC
  Filled 2020-01-13: qty 1

## 2020-01-13 MED ORDER — DIAZEPAM 5 MG PO TABS
5.0000 mg | ORAL_TABLET | Freq: Once | ORAL | Status: AC
  Administered 2020-01-13: 5 mg via ORAL
  Filled 2020-01-13: qty 1

## 2020-01-13 MED ORDER — SODIUM CHLORIDE 0.9% FLUSH: 3.0000 mL | Freq: Once | INTRAVENOUS | Status: DC

## 2020-01-13 MED ORDER — KETOROLAC TROMETHAMINE 15 MG/ML IJ SOLN
15.0000 mg | Freq: Once | INTRAMUSCULAR | Status: AC
  Administered 2020-01-13: 15 mg via INTRAVENOUS

## 2020-01-13 NOTE — ED Notes (Signed)
Pt left ama, said "Everything looks good so I think I can go home."

## 2020-01-13 NOTE — ED Provider Notes (Signed)
[facility] EMERGENCY DEPARTMENT Provider Note   CSN: [id number] Arrival date & time: 01/13/20  1003     History Chief Complaint  Patient presents with  . Chest Pain    Garrett Schroeder is a 49 y.o. male.  49 y.o male with a PMH of HIV, Psoriatic arthritis, presents to the ED with a chief complaint of chest pain x 2 weeks. Intermittent pain is left sided sharp and tight with radiation to his left arm. Exacerbated with movement of the left arm. Has not taken medication for improvement in symptoms. He is currently employed at [employer] along with [employer] at night. He is a current 1 pack a day smoker.    Chest Pain Pain location:  L chest Pain quality: sharp and tightness   Pain radiates to:  L arm Pain severity:  Mild Onset quality:  Gradual Duration:  2 weeks Timing:  Intermittent Progression:  Worsening Chronicity:  New Context: drug use and movement   Relieved by:  Nothing Worsened by:  Exertion and movement Ineffective treatments:  None tried Associated symptoms: shortness of breath   Associated symptoms: no abdominal pain, no altered mental status, no back pain, no cough, no dizziness, no fever, no headache, no nausea, no syncope and no vomiting   Risk factors: male sex and smoking        Past Medical History:  Diagnosis Date  . HIV infection ([facility])   . Psoriatic arthritis mutilans [facility])     Patient Active Problem List   Diagnosis Date Noted  . Depression 02/06/2018  . Bone deformity 02/06/2018  . Screening examination for venereal disease 02/16/2016  . Encounter for long-term (current) use of medications 02/16/2016  . Psoriatic arthritis ([facility]) 05/19/2013  . Smoker 11/15/2011  . Human immunodeficiency virus (HIV) disease ([facility]) 03/18/2008    History reviewed. No pertinent surgical history.     Family History  Problem Relation Age of Onset  . Arthritis Neg Hx     Social History   Tobacco Use  . Smoking status: Current Every Day Smoker    Packs/day: 1.00    Years: 36.00    Pack years: 36.00    Types: Cigarettes  . Smokeless tobacco: Never Used  Substance Use Topics  . Alcohol use: Yes    Alcohol/week: 5.0 standard drinks    Types: 5 Standard drinks or equivalent per week  . Drug use: Yes    Frequency: 20.0 times per week    Types: Marijuana    Home Medications Prior to Admission medications   Medication Sig Start Date End Date Taking? Authorizing Provider  cyanocobalamin 1000 MCG tablet Take 1 tablet by mouth daily. 01/01/20 06/29/20 Yes [provider]  ergocalciferol (VITAMIN D2) 1.25 MG (50000 UT) capsule Take 50,000 Units by mouth once a week. 01/01/20 03/31/20 Yes [provider]  ibuprofen (ADVIL) 200 MG tablet Take 2,000-2,400 mg by mouth daily as needed (pain).   Yes [provider]  predniSONE (DELTASONE) 10 MG tablet Take 10-30 mg by mouth See admin instructions. Take 30mg  daily for 7 days then 20mg  daily for 7 days then 10mg  daily for 7 days.   Yes [provider]  SYMTUZA 800-150-200-10 MG TABS TAKE 1 TABLET BY MOUTH DAILY WITH BREAKFAST. Patient taking differently: Take 1 tablet by mouth daily with breakfast.  09/25/19  Yes Comer, , MD  TIVICAY 50 MG tablet TAKE 1 TABLET (50 MG TOTAL) BY MOUTH DAILY. Patient taking differently: Take 50 mg by mouth  daily.  09/25/19  Yes Comer, Okey Regal, MD  Adalimumab 40 MG/0.4ML PNKT Inject 0.4 mLs into the skin every 14 (fourteen) days. 01/01/20 03/31/20  [provider]    Allergies    Codeine  Review of Systems   Review of Systems  Constitutional: Negative for fever.  HENT: Negative for sore throat.   Respiratory: Positive for shortness of breath. Negative for cough.   Cardiovascular: Positive for chest pain. Negative for syncope.  Gastrointestinal: Negative for abdominal pain, nausea and vomiting.  Genitourinary: Negative for flank pain.  Musculoskeletal: Negative for back pain.  Skin: Positive for rash  (psoriasis). Negative for pallor and wound.  Neurological: Negative for dizziness, syncope and headaches.  All other systems reviewed and are negative.   Physical Exam Updated Vital Signs BP 121/69 (BP Location: Right Arm)   Pulse 93   Temp 98.4 F (36.9 C)   Resp 17   SpO2 98%   Physical Exam Vitals and nursing note reviewed.  Constitutional:      Appearance: He is well-developed. He is not ill-appearing or diaphoretic.  HENT:     Head: Normocephalic and atraumatic.  Eyes:     Pupils: Pupils are equal, round, and reactive to light.  Cardiovascular:     Rate and Rhythm: Normal rate.     Comments: No BL calf tenderness, no pitting edema.  Pulmonary:     Effort: Pulmonary effort is normal.     Breath sounds: No wheezing.     Comments: Lungs are clear to auscultation without wheezing, rhonchi, or rales.  Chest:     Chest wall: No tenderness.  Abdominal:     Palpations: Abdomen is soft. There is no mass.     Tenderness: There is no guarding.  Musculoskeletal:     Cervical back: Normal range of motion and neck supple.     Right lower leg: No tenderness. No edema.     Left lower leg: No tenderness. No edema.  Skin:    General: Skin is warm and dry.     Findings: Rash present.          Comments: Scaling psoriatic rash present on abdomen, and chest.   Neurological:     Mental Status: He is alert and oriented to person, place, and time.     ED Results / Procedures / Treatments   Labs (all labs ordered are listed, but only abnormal results are displayed) Labs Reviewed  BASIC METABOLIC PANEL - Abnormal; Notable for the following components:      Result Value   Glucose, Bld 214 (*)    All other components within normal limits  CBC  URINALYSIS, ROUTINE W REFLEX MICROSCOPIC  TROPONIN I (HIGH SENSITIVITY)  TROPONIN I (HIGH SENSITIVITY)    EKG EKG Interpretation  Date/Time:  Wednesday January 13 2020 10:10:50 EST Ventricular Rate:  94 PR Interval:  124 QRS Duration:  82 QT Interval:  350 QTC Calculation: 437 R Axis:   59 Text Interpretation: Normal sinus rhythm Non-specific ST-t changes No previous tracing Confirmed by Lajean Saver [phone]) on 01/13/2020 11:45:31 AM   Radiology DG Chest 2 View  Result Date: 01/13/2020 CLINICAL DATA:  Left-sided chest pain with left arm pain EXAM: CHEST - 2 VIEW COMPARISON:  02/15/2008 FINDINGS: Normal heart size and mediastinal contours. There is no edema, consolidation, effusion, or pneumothorax. Mild interstitial prominence is stable from before. Artifact from EKG leads. IMPRESSION: Stable exam.  No evidence of acute disease. Electronically Signed   By: Angelica Chessman  Watts M.D.   On: 01/13/2020 10:35    Procedures Procedures (including critical care time)  Medications Ordered in ED Medications  sodium chloride flush (NS) 0.9 % injection 3 mL (3 mLs Intravenous Not Given 01/13/20 1113)  diazepam (VALIUM) tablet 5 mg (5 mg Oral Given 01/13/20 1227)  ketorolac (TORADOL) 15 MG/ML injection 15 mg (15 mg Intravenous Given 01/13/20 1228)    ED Course  I have reviewed the triage vital signs and the nursing notes.  Pertinent labs & imaging results that were available during my care of the patient were reviewed by me and considered in my medical decision making (see chart for details).    MDM Rules/Calculators/A&P     Patient with a chief complaint of chest pain for the past 2 weeks, currently employed at [employer] along with [employer] does a heavy amount of lifting at his job. Reports the pain is sharp along with tightness which is worst with lifting of his left arm. Has not tried a medication for improvement in his symptoms. Currently being treated for psoriatic arthritis, has not began his Humira treatment. He does smoke 1 pack of cigarettes daily, no cough, fevers, sick contacts.  During my evaluation patient is overall well in appearance, lungs are clear to auscultation, no murmurs auscultated. Abdomen is soft, nontender to  palpation along with no mass present. Pulses are symmetric to DP and radial. No signs of any pitting edema or prior history of CHF.  Interpretation of his labs reveal a BMP with no electrolyte abnormality, glucose is elevated at 214, no prior history of diabetes. Creatinine levels within normal limits, no anion gap. CBC without any leukocytosis or signs of anemia. First troponin is negative. EKG without any consolidation, pneumothorax or pleural effusion. Patient's pain is waxing and waning and worse with movement of his left arm, suspect that this is likely MSK in nature. Provided with Valium along with Toradol to help with his symptoms. He does not have a current cardiologist and has never had an echo work-up, no prior history of MI or family history of CAD.   HEART SCORE 1  1:03 PM patient is refusing to stay for delta troponin although recommended to do so.  Reports there has been no much improvement with muscle relaxer along with Toradol.  He was advised that he will need to have scheduled an appointment with cardiology to further evaluate his chest pain.  He does not have a PCP, tried to provide him with 1.  He is choosing to leave AGAINST MEDICAL ADVICE as his wife has been waiting in the lobby and on for a long time.  Unable to complete further work-up.     Portions of this note were generated with Scientist, clinical (histocompatibility and immunogenetics). Dictation errors may occur despite best attempts at proofreading.  Final Clinical Impression(s) / ED Diagnoses Final diagnoses:  Atypical chest pain    Rx / DC Orders ED Discharge Orders    None       Claude Manges, PA-C 01/13/20 1303    Cathren Laine, MD 01/22/20 1451

## 2020-01-13 NOTE — Telephone Encounter (Signed)
Patient called to notify [name] that he was seen today in the ED for chest pain and his arm tingling. Patient also was told he was prediabetic. Patient does not have primary care. Informed him that he needs to contact Medicaid to identify PCP's in the area taht are accepting new patients. Will also message patient via MyChart with Internal Medicine and [facility] Well[facility] information.   Garrett Schroeder Loyola Mast, RN

## 2020-01-13 NOTE — ED Triage Notes (Signed)
Pt reports L sided chest pain with L arm pain that has been onging x2-3 weeks. He also endorses some sob. States that pain has been progressing since it first began. resp e/u, nad.

## 2020-01-28 MED FILL — TIVICAY 50 MG TABLET: 50 | 30 days supply | Qty: 30 | Fill #4

## 2020-01-28 MED FILL — BETAMETHASONE VALERATE 0.1: 0.1 | 25 days supply | Qty: 30 | Fill #3

## 2020-01-28 MED FILL — SYMTUZA 800-150-200-10 MG T: 800-150-200 | 30 days supply | Qty: 30 | Fill #4

## 2020-02-23 MED FILL — BETAMETHASONE VALERATE 0.1: 0.1 | 25 days supply | Qty: 30 | Fill #4

## 2020-02-23 MED FILL — SYMTUZA 800-150-200-10 MG T: 800-150-200 | 30 days supply | Qty: 30 | Fill #5

## 2020-02-23 MED FILL — TIVICAY 50 MG TABLET: 50 | 30 days supply | Qty: 30 | Fill #5

## 2020-03-21 ENCOUNTER — Other Ambulatory Visit: Payer: Self-pay | Admitting: Internal Medicine

## 2020-03-21 DIAGNOSIS — B20 Human immunodeficiency virus [HIV] disease: Secondary | ICD-10-CM

## 2020-03-22 IMAGING — DX DG CHEST 2V
2 series · 2 of 2 positions shown · non-contrast
Comparison: 02/15/2008

CLINICAL DATA: Left-sided chest pain with left arm pain

EXAM:
CHEST - 2 VIEW

[w chest pa]
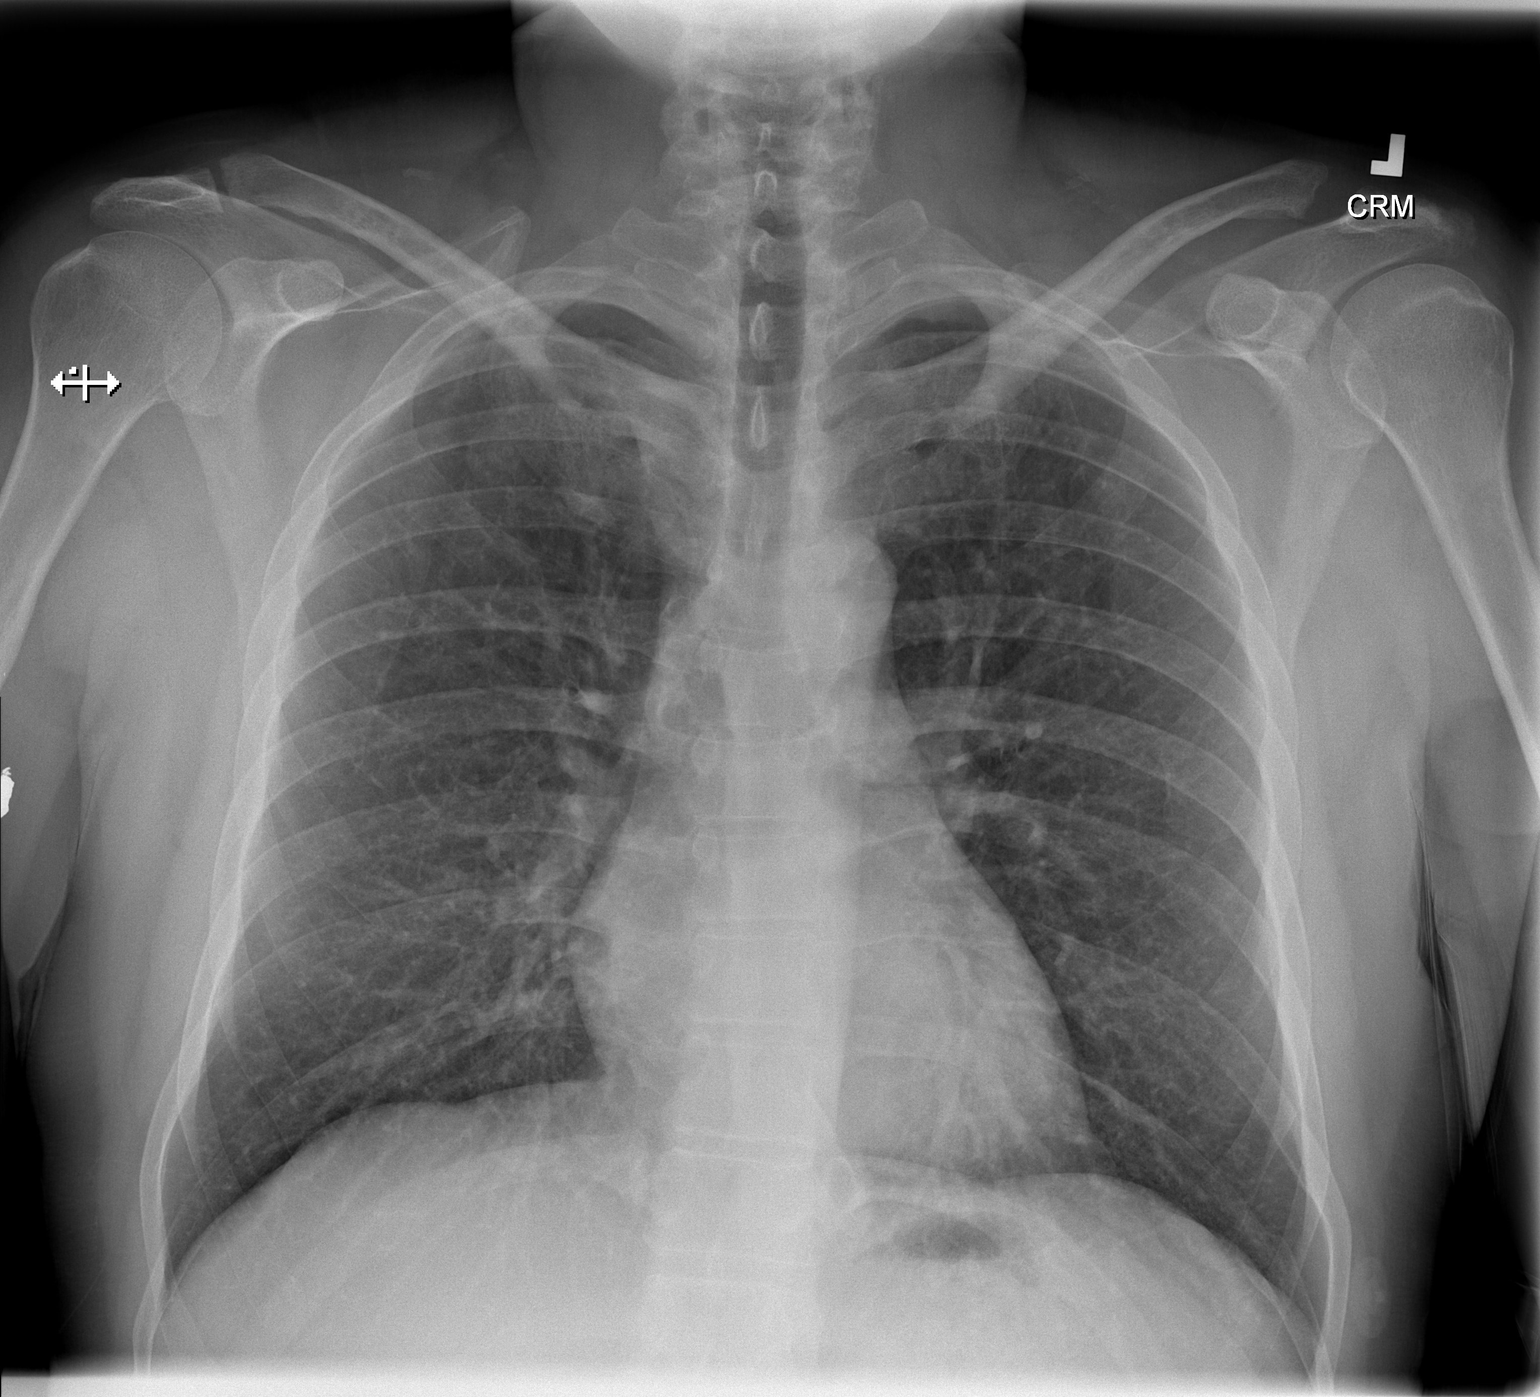

[w chest lat]
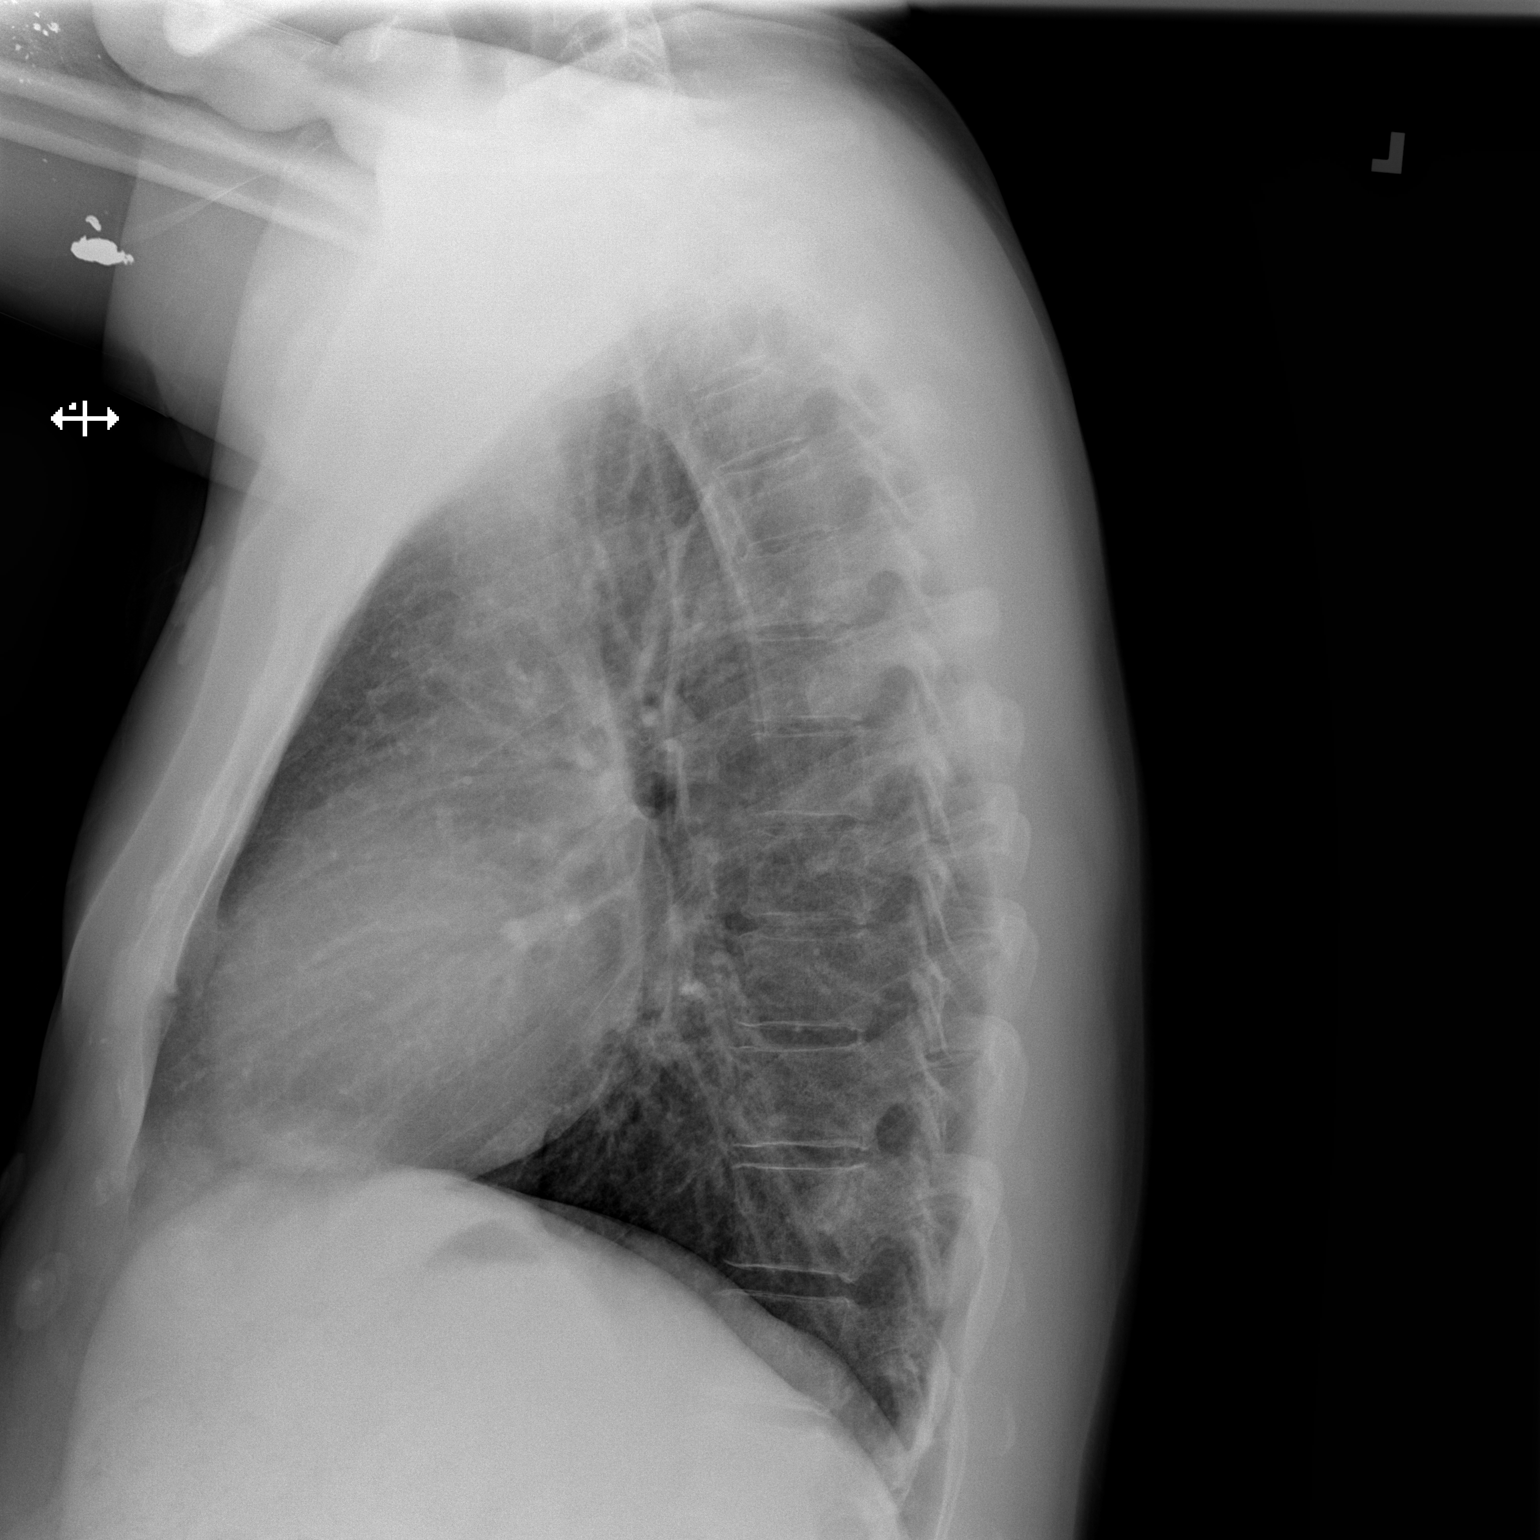

[2 of 2 positions shown; findings below may reference images not displayed]

FINDINGS: Normal heart size and mediastinal contours. There is no edema,
consolidation, effusion, or pneumothorax. Mild interstitial
prominence is stable from before. Artifact from EKG leads.
IMPRESSION: Stable exam.  No evidence of acute disease.

## 2020-03-30 ENCOUNTER — Other Ambulatory Visit: Payer: Medicaid Other

## 2020-03-30 MED FILL — BETAMETHASONE VALERATE 0.1: 0.1 | 25 days supply | Qty: 30 | Fill #5

## 2020-03-30 MED FILL — SYMTUZA 800-150-200-10 MG T: 800-150-200 | 30 days supply | Qty: 30 | Fill #0

## 2020-03-30 MED FILL — TIVICAY 50 MG TABLET: 50 | 30 days supply | Qty: 30 | Fill #0

## 2020-04-01 ENCOUNTER — Other Ambulatory Visit: Payer: Medicaid Other

## 2020-04-12 ENCOUNTER — Other Ambulatory Visit: Payer: Medicaid Other

## 2020-04-12 ENCOUNTER — Other Ambulatory Visit: Payer: Self-pay

## 2020-04-12 DIAGNOSIS — Z113 Encounter for screening for infections with a predominantly sexual mode of transmission: Secondary | ICD-10-CM

## 2020-04-12 DIAGNOSIS — B20 Human immunodeficiency virus [HIV] disease: Secondary | ICD-10-CM

## 2020-04-12 DIAGNOSIS — Z79899 Other long term (current) drug therapy: Secondary | ICD-10-CM

## 2020-04-13 ENCOUNTER — Encounter: Payer: Medicaid Other | Admitting: Internal Medicine

## 2020-04-13 LAB — COMPLETE METABOLIC PANEL WITH GFR
AG Ratio: 1.9 (calc) (ref 1.0–2.5)
ALT: 47 U/L — ABNORMAL HIGH (ref 9–46)
AST: 39 U/L (ref 10–40)
Albumin: 4.7 g/dL (ref 3.6–5.1)
Alkaline phosphatase (APISO): 56 U/L (ref 36–130)
BUN/Creatinine Ratio: 19 (calc) (ref 6–22)
BUN: 26 mg/dL — ABNORMAL HIGH (ref 7–25)
CO2: 24 mmol/L (ref 20–32)
Calcium: 10.4 mg/dL — ABNORMAL HIGH (ref 8.6–10.3)
Chloride: 105 mmol/L (ref 98–110)
Creat: 1.37 mg/dL — ABNORMAL HIGH (ref 0.60–1.35)
GFR, Est African American: 70 mL/min/{1.73_m2} (ref >=60)
GFR, Est Non African American: 61 mL/min/{1.73_m2} (ref >=60)
Globulin: 2.5 g/dL (calc) (ref 1.9–3.7)
Glucose, Bld: 112 mg/dL — ABNORMAL HIGH (ref 65–99)
Potassium: 3.9 mmol/L (ref 3.5–5.3)
Sodium: 140 mmol/L (ref 135–146)
Total Bilirubin: 0.7 mg/dL (ref 0.2–1.2)
Total Protein: 7.2 g/dL (ref 6.1–8.1)

## 2020-04-13 LAB — LIPID PANEL
Cholesterol: 272 mg/dL — ABNORMAL HIGH (ref <200)
HDL: 50 mg/dL (ref >=40)
LDL Cholesterol (Calc): 165 mg/dL (calc) — ABNORMAL HIGH
Non-HDL Cholesterol (Calc): 222 mg/dL (calc) — ABNORMAL HIGH (ref <130)
Total CHOL/HDL Ratio: 5.4 (calc) — ABNORMAL HIGH (ref <5.0)
Triglycerides: 339 mg/dL — ABNORMAL HIGH (ref <150)

## 2020-04-13 LAB — CBC WITH DIFFERENTIAL/PLATELET
Absolute Monocytes: 1131 cells/uL — ABNORMAL HIGH (ref 200–950)
Basophils Absolute: 68 cells/uL (ref 0–200)
Basophils Relative: 0.8 %
Eosinophils Absolute: 153 cells/uL (ref 15–500)
Eosinophils Relative: 1.8 %
HCT: 44 % (ref 38.5–50.0)
Hemoglobin: 15.5 g/dL (ref 13.2–17.1)
Lymphs Abs: 1590 cells/uL (ref 850–3900)
MCH: 35.6 pg — ABNORMAL HIGH (ref 27.0–33.0)
MCHC: 35.2 g/dL (ref 32.0–36.0)
MCV: 100.9 fL — ABNORMAL HIGH (ref 80.0–100.0)
MPV: 9.8 fL (ref 7.5–12.5)
Monocytes Relative: 13.3 %
Neutro Abs: 5559 cells/uL (ref 1500–7800)
Neutrophils Relative %: 65.4 %
Platelets: 237 10*3/uL (ref 140–400)
RBC: 4.36 10*6/uL (ref 4.20–5.80)
RDW: 12.2 % (ref 11.0–15.0)
Total Lymphocyte: 18.7 %
WBC: 8.5 10*3/uL (ref 3.8–10.8)

## 2020-04-13 LAB — T-HELPER CELL (CD4) - (RCID CLINIC ONLY)
CD4 % Helper T Cell: 36 % (ref 33–65)
CD4 T Cell Abs: 703 /uL (ref 400–1790)

## 2020-04-13 LAB — HIV-1 RNA QUANT-NO REFLEX-BLD
HIV 1 RNA Quant: <20 copies/mL
HIV-1 RNA Quant, Log: <1.3 Log copies/mL

## 2020-04-13 LAB — RPR: RPR Ser Ql: NONREACTIVE

## 2020-04-19 ENCOUNTER — Encounter: Payer: Self-pay | Admitting: Internal Medicine

## 2020-04-19 ENCOUNTER — Ambulatory Visit (INDEPENDENT_AMBULATORY_CARE_PROVIDER_SITE_OTHER): Payer: Medicaid Other | Admitting: Internal Medicine

## 2020-04-19 ENCOUNTER — Other Ambulatory Visit: Payer: Self-pay

## 2020-04-19 VITALS — BP 120/80 | HR 85 | Temp 98.4°F | Wt 170.0 lb

## 2020-04-19 DIAGNOSIS — L4050 Arthropathic psoriasis, unspecified: Secondary | ICD-10-CM | POA: Diagnosis not present

## 2020-04-19 DIAGNOSIS — B20 Human immunodeficiency virus [HIV] disease: Secondary | ICD-10-CM

## 2020-04-19 DIAGNOSIS — Z113 Encounter for screening for infections with a predominantly sexual mode of transmission: Secondary | ICD-10-CM | POA: Diagnosis not present

## 2020-04-19 NOTE — Assessment & Plan Note (Signed)
Screened negative 

## 2020-04-19 NOTE — Patient Instructions (Signed)
Primary Care Offices:  [facility] [phone]  Internal Medicine [phone]

## 2020-04-19 NOTE — Assessment & Plan Note (Signed)
He continues to do very well with no concerns.   Continue with current regimen and rtc 6 months.

## 2020-04-19 NOTE — Progress Notes (Signed)
   Subjective:    Patient ID: Garrett Schroeder, male    DOB: 07/14/1971, 49 y.o.   MRN: [id number]  HPI Here for follow up of HIV Has been off and on treatment and intermittent care.  Came back in September 2019 interested to start back.  Started [place] and [place].   Married now and taking care of himself.  CD4 703, viral load < 20.  Refuses COVID vaccine.   Review of Systems  Constitutional: Negative for fatigue.  Gastrointestinal: Negative for diarrhea.  Neurological: Negative for dizziness.       Objective:   Physical Exam Constitutional:      General: He is not in acute distress.    Appearance: He is well-developed.  Eyes:     General: No scleral icterus. Cardiovascular:     Rate and Rhythm: Normal rate and regular rhythm.     Heart sounds: Normal heart sounds. No murmur heard.   Pulmonary:     Effort: Pulmonary effort is normal. No respiratory distress.     Breath sounds: Normal breath sounds.    SH: some marijuana, decreased alcohol intake; continues to smoke but trying to quit.        Assessment & Plan:

## 2020-04-19 NOTE — Assessment & Plan Note (Signed)
Now on Humira via rheumatology.

## 2020-04-27 ENCOUNTER — Other Ambulatory Visit: Payer: Self-pay | Admitting: Internal Medicine

## 2020-04-27 DIAGNOSIS — B20 Human immunodeficiency virus [HIV] disease: Secondary | ICD-10-CM

## 2020-05-05 MED FILL — TIVICAY 50 MG TABLET: 50 | 30 days supply | Qty: 30 | Fill #0

## 2020-05-05 MED FILL — SYMTUZA 800-150-200-10 MG T: 800-150-200 | 30 days supply | Qty: 30 | Fill #0

## 2020-05-23 ENCOUNTER — Telehealth: Payer: Self-pay

## 2020-05-23 NOTE — Telephone Encounter (Signed)
Patient to call [facility] tomorrow with medicaid information.   PA forms left in triage for symtuza. Call placed to [facility] PA call center to ensure they received previously faxed PA forms with medical records attached. Rep was unable to find patient in her system. Garrett Schroeder

## 2020-05-26 NOTE — Telephone Encounter (Signed)
[facility]ed  [facility] to follow up on PA for Symtuza. Per rep PA does not require PA since it is a preferred medication. States that pharmacy is billing medication under Medicaid direct. Patient is now under [employer] Will need to correct this in order to process medication.  Spoke with Pharmacy tech at [facility] who states they are not needing PA at this time. Lorenso Courier, [place]

## 2020-05-27 NOTE — Telephone Encounter (Signed)
Spoke with pharmacy, all has been resolved. Andree Coss, RN

## 2020-06-06 MED FILL — SYMTUZA 800-150-200-10 MG T: 800-150-200 | 30 days supply | Qty: 30 | Fill #1

## 2020-06-06 MED FILL — TIVICAY 50 MG TABLET: 50 | 30 days supply | Qty: 30 | Fill #1

## 2020-06-30 MED FILL — TIVICAY 50 MG TABLET: 50 | 30 days supply | Qty: 30 | Fill #2

## 2020-06-30 MED FILL — SYMTUZA 800-150-200-10 MG T: 800-150-200 | 30 days supply | Qty: 30 | Fill #2

## 2020-07-26 MED FILL — TIVICAY 50 MG TABLET: 50 | 30 days supply | Qty: 30 | Fill #3

## 2020-07-26 MED FILL — SYMTUZA 800-150-200-10 MG T: 800-150-200 | 30 days supply | Qty: 30 | Fill #3

## 2020-08-22 MED FILL — SYMTUZA 800-150-200-10 MG T: 800-150-200 | 30 days supply | Qty: 30 | Fill #4

## 2020-08-22 MED FILL — TIVICAY 50 MG TABLET: 50 | 30 days supply | Qty: 30 | Fill #4

## 2020-09-06 ENCOUNTER — Telehealth: Payer: Self-pay

## 2020-09-06 NOTE — Telephone Encounter (Signed)
thanks

## 2020-09-06 NOTE — Telephone Encounter (Signed)
Patient states he has a "growth" on his left middle finger that have been there for several months, but is now starting to hurt and is affecting his job. Patient does not recall if he actually injured his finger at some point. Patient requesting a dermatology referral.   Advised patient to call [facility] to establish primary care and provided him with the phone number. Will also route message to MD for referral if appropriate.  Valarie Cones

## 2020-09-15 ENCOUNTER — Other Ambulatory Visit: Payer: Self-pay | Admitting: Internal Medicine

## 2020-09-15 DIAGNOSIS — B20 Human immunodeficiency virus [HIV] disease: Secondary | ICD-10-CM

## 2020-09-20 MED FILL — TIVICAY 50 MG TABLET: 50 | 30 days supply | Qty: 30 | Fill #0

## 2020-09-20 MED FILL — SYMTUZA 800-150-200-10 MG T: 800-150-200 | 30 days supply | Qty: 30 | Fill #0

## 2020-09-28 ENCOUNTER — Other Ambulatory Visit: Payer: Medicaid Other

## 2020-10-05 ENCOUNTER — Other Ambulatory Visit: Payer: Medicaid Other

## 2020-10-17 ENCOUNTER — Encounter: Payer: Medicaid Other | Admitting: Internal Medicine

## 2020-10-26 ENCOUNTER — Other Ambulatory Visit: Payer: Medicaid Other

## 2020-10-27 ENCOUNTER — Other Ambulatory Visit: Payer: Medicaid Other

## 2020-11-02 MED FILL — TIVICAY 50 MG TABLET: 50 | 30 days supply | Qty: 30 | Fill #1

## 2020-11-02 MED FILL — SYMTUZA 800-150-200-10 MG T: 800-150-200 | 30 days supply | Qty: 30 | Fill #1

## 2020-11-03 ENCOUNTER — Other Ambulatory Visit: Payer: Self-pay

## 2020-11-03 ENCOUNTER — Other Ambulatory Visit: Payer: Medicaid Other

## 2020-11-03 DIAGNOSIS — B20 Human immunodeficiency virus [HIV] disease: Secondary | ICD-10-CM

## 2020-11-03 LAB — T-HELPER CELL (CD4) - (RCID CLINIC ONLY)
CD4 % Helper T Cell: 33 % (ref 33–65)
CD4 T Cell Abs: 497 /uL (ref 400–1790)

## 2020-11-14 LAB — HIV-1 RNA QUANT-NO REFLEX-BLD
HIV 1 RNA Quant: 57 Copies/mL — ABNORMAL HIGH
HIV-1 RNA Quant, Log: 1.76 Log cps/mL — ABNORMAL HIGH

## 2020-11-21 ENCOUNTER — Other Ambulatory Visit: Payer: Self-pay

## 2020-11-21 ENCOUNTER — Encounter: Payer: Self-pay | Admitting: Internal Medicine

## 2020-11-21 ENCOUNTER — Telehealth (INDEPENDENT_AMBULATORY_CARE_PROVIDER_SITE_OTHER): Payer: Medicaid Other | Admitting: Internal Medicine

## 2020-11-21 DIAGNOSIS — Z79899 Other long term (current) drug therapy: Secondary | ICD-10-CM | POA: Diagnosis not present

## 2020-11-21 DIAGNOSIS — Z113 Encounter for screening for infections with a predominantly sexual mode of transmission: Secondary | ICD-10-CM | POA: Diagnosis not present

## 2020-11-21 DIAGNOSIS — B20 Human immunodeficiency virus [HIV] disease: Secondary | ICD-10-CM | POA: Diagnosis not present

## 2020-11-21 NOTE — Progress Notes (Signed)
I connected with  Garrett Schroeder on 11/21/20 by phone and verified that I am speaking with the correct person using two identifiers.   I discussed the limitations of evaluation and management by telemedicine. The patient expressed understanding and agreed to proceed. Time spent on visit: 15 mintues  Location Patient: home Physician: home office  CC: follow up of HIV  HPI: overall doing well with no concerns. Did miss a couple of doses since his last visit including one around the time of his labs.  Otherwise no complaints.    ROS: Constitutional: no fatigue GI: no n/v/d Skin: no rashes

## 2020-11-21 NOTE — Assessment & Plan Note (Signed)
He is doing well, no issues and will continue with his current regimen.  rtc in 6 months.

## 2020-12-05 ENCOUNTER — Other Ambulatory Visit: Payer: Self-pay | Admitting: Internal Medicine

## 2020-12-05 DIAGNOSIS — B20 Human immunodeficiency virus [HIV] disease: Secondary | ICD-10-CM

## 2020-12-05 MED FILL — SYMTUZA 800-150-200-10 MG T: 800-150-200 | 30 days supply | Qty: 30 | Fill #0

## 2020-12-05 MED FILL — TIVICAY 50 MG TABLET: 50 | 30 days supply | Qty: 30 | Fill #0

## 2021-01-02 MED FILL — TIVICAY 50 MG TABLET: 50 | 30 days supply | Qty: 30 | Fill #1

## 2021-01-02 MED FILL — SYMTUZA 800-150-200-10 MG T: 800-150-200 | 30 days supply | Qty: 30 | Fill #1

## 2021-01-03 ENCOUNTER — Telehealth: Payer: Self-pay

## 2021-01-03 NOTE — Telephone Encounter (Signed)
Patient called office today requesting referral for counseling to help with anger management/ bipolar disorder. Patient would like to be referred to someone who can prescribe something to help stabilize mood.  Does not have a PCP. Declined appointment with Marylu Lund.

## 2021-01-04 ENCOUNTER — Other Ambulatory Visit: Payer: Self-pay | Admitting: Internal Medicine

## 2021-01-04 DIAGNOSIS — F32A Depression, unspecified: Secondary | ICD-10-CM

## 2021-01-04 NOTE — Telephone Encounter (Signed)
Referral has been placed. 

## 2021-01-10 ENCOUNTER — Ambulatory Visit (HOSPITAL_COMMUNITY): Payer: Medicaid Other | Admitting: Professional

## 2021-01-19 ENCOUNTER — Ambulatory Visit (HOSPITAL_COMMUNITY): Payer: Medicaid Other | Admitting: Professional

## 2021-01-27 ENCOUNTER — Other Ambulatory Visit (HOSPITAL_COMMUNITY): Payer: Self-pay

## 2021-02-08 ENCOUNTER — Other Ambulatory Visit (HOSPITAL_COMMUNITY): Payer: Self-pay

## 2021-03-02 ENCOUNTER — Other Ambulatory Visit (HOSPITAL_COMMUNITY): Payer: Self-pay

## 2021-03-02 MED FILL — Darunavir-Cobic-Emtricitab-Tenofov AF Tab 800-150-200-10 MG: ORAL | 30 days supply | Qty: 30 | Fill #0 | Status: AC

## 2021-03-02 MED FILL — Dolutegravir Sodium Tab 50 MG (Base Equiv): ORAL | 30 days supply | Qty: 30 | Fill #0 | Status: AC

## 2021-03-06 ENCOUNTER — Other Ambulatory Visit (HOSPITAL_COMMUNITY): Payer: Self-pay

## 2021-04-04 ENCOUNTER — Other Ambulatory Visit: Payer: Self-pay

## 2021-04-04 ENCOUNTER — Ambulatory Visit (HOSPITAL_COMMUNITY)
Admission: EM | Admit: 2021-04-04 | Discharge: 2021-04-04 | Disposition: A | Discharge status: Home / Self Care | Payer: Medicaid Other | Attending: Registered Nurse | Admitting: Registered Nurse

## 2021-04-04 ENCOUNTER — Encounter (HOSPITAL_COMMUNITY): Payer: Self-pay | Admitting: Registered Nurse

## 2021-04-04 DIAGNOSIS — F331 Major depressive disorder, recurrent, moderate: Secondary | ICD-10-CM | POA: Diagnosis present

## 2021-04-04 NOTE — BH Assessment (Signed)
Patient Garrett Schroeder denies SI/ HI / AVH . reports using THC and Alcohol . Patient need medication management . Patient is routine

## 2021-04-04 NOTE — ED Notes (Signed)
Pt discharged in no acute distress. Verbalized understanding of discharge instructions reviewed by staff. Safety maintained.  

## 2021-04-04 NOTE — ED Provider Notes (Addendum)
Behavioral Health Urgent Care Medical Screening Exam  Patient Name: Garrett Schroeder MRN: [id number] Date of Evaluation: 04/04/21 Chief Complaint:   Diagnosis:  Final diagnoses:  Major depressive disorder, recurrent episode, moderate (HCC)    History of Present illness: Garrett Schroeder is a 50 y.o. male patient presented to [facility] as a walk in accompanied by his wife requesting outpatient psychiatric services for medication management.   [name], 50 y.o., male patient seen face to face by this provider, consulted with Dr. Earlene Plater; and chart reviewed on 04/04/21.  On evaluation Garrett Schroeder reports he currently sees a therapist but would like to get back on psychotropic medications.  States that psychiatry is not offered where he is currently receiving therapy.  Patient states he was started on Paxil about 7 months ago but only took it twice because it made him sleepy. Patient denies suicidal/self-harm/homicidal ideation, psychosis, and paranoia. Discussed various outpatient psychiatric services and resources.  During evaluation [name] is sitting up right in chair in no acute distress.  He is alert, oriented x 4, calm and cooperative.  His mood is euthymic with congruent affect.  He does not appear to be responding to internal/external stimuli or delusional thoughts.  Patient denies suicidal/self-harm/homicidal ideation, psychosis, and paranoia.  Patient answered question appropriately.       Psychiatric Specialty Exam  Presentation  General Appearance:Appropriate for Environment; Casual  Eye Contact:Good  Speech:Clear and Coherent; Normal Rate  Speech Volume:Normal  Handedness:Right   Mood and Affect  Mood:Euthymic  Affect:Appropriate; Congruent   Thought Process  Thought Processes:Coherent; Goal Directed  Descriptions of Associations:No data recorded Orientation:Full (Time, Place and Person)  Thought Content:WDL    Hallucinations:None  Ideas of  Reference:None  Suicidal Thoughts:No  Homicidal Thoughts:No   Sensorium  Memory:Immediate Good; Recent Good; Remote Good  Judgment:Intact  Insight:Present   Executive Functions  Concentration:Good  Attention Span:Good  Recall:Good  Fund of Knowledge:Good  Language:Good   Psychomotor Activity  Psychomotor Activity:Normal   Assets  Assets:Communication Skills; Desire for Improvement; Financial Resources/Insurance; Housing; Leisure Time; Social Support; Transportation   Sleep  Sleep:Good  Number of hours: No data recorded  Nutritional Assessment (For OBS and FBC admissions only) Has the patient had a weight loss or gain of 10 pounds or more in the last 3 months?: No Has the patient had a decrease in food intake/or appetite?: No Does the patient have dental problems?: No Does the patient have eating habits or behaviors that may be indicators of an eating disorder including binging or inducing vomiting?: No Has the patient recently lost weight without trying?: No Has the patient been eating poorly because of a decreased appetite?: No Malnutrition Screening Tool Score: 0    Physical Exam: Physical Exam Vitals and nursing note reviewed.  Constitutional:      General: He is not in acute distress.    Appearance: Normal appearance. He is not ill-appearing.  HENT:     Head: Normocephalic.  Cardiovascular:     Rate and Rhythm: Normal rate.  Pulmonary:     Effort: Pulmonary effort is normal.  Musculoskeletal:        General: Normal range of motion.     Cervical back: Normal range of motion.  Skin:    General: Skin is warm and dry.  Neurological:     Mental Status: He is alert and oriented to person, place, and time.  Psychiatric:        Attention and Perception: Attention and perception normal. He does  not perceive auditory or visual hallucinations.        Mood and Affect: Mood and affect normal.        Speech: Speech normal.        Behavior: Behavior  normal. Behavior is cooperative.        Thought Content: Thought content normal. Thought content is not paranoid or delusional. Thought content does not include homicidal or suicidal ideation.        Cognition and Memory: Cognition and memory normal.        Judgment: Judgment normal.    Review of Systems  Constitutional: Negative.   HENT: Negative.   Eyes: Negative.   Respiratory: Negative.   Cardiovascular: Negative.   Gastrointestinal: Negative.   Genitourinary: Negative.   Musculoskeletal: Negative.   Skin: Negative.   Neurological: Negative.   Endo/Heme/Allergies: Negative.   Psychiatric/Behavioral: Positive for substance abuse. Negative for hallucinations, memory loss and suicidal ideas. Depression: Stable. The patient does not have insomnia. Nervous/anxious: Stable.    Blood pressure 124/75, pulse 93, temperature 98.5 F (36.9 C), temperature source Oral, resp. rate 20, SpO2 96 %. There is no height or weight on file to calculate BMI.  Musculoskeletal: Strength & Muscle Tone: within normal limits Gait & Station: normal Patient leans: N/A   [facility] MSE Discharge Disposition for Follow up and Recommendations: Based on my evaluation the patient does not appear to have an emergency medical condition and can be discharged with resources and follow up care in outpatient services for Medication Management, Substance Abuse Intensive Outpatient Program and Individual Therapy    Follow-up Information    Call  [facility].   Why: In-office and online appointments available Contact information: [address]  [address] [place] [address] [phone]        Call  Center[facility].   Specialty: Behavioral Health Why: to schedule appointment Contact information: [address] [place] [address] [phone]        [facility] [employer].   Specialty: Pharmacist, hospital Why: Call  or walk in for medication management Contact information: [employer] [facility] [place] [address] [place] [address] [phone]        Inc, [address] [facility].   Contact information: [address] [place] [address] [phone]               Assunta Found, NP 04/04/2021, 4:29 PM

## 2021-04-05 ENCOUNTER — Encounter (HOSPITAL_COMMUNITY): Payer: Self-pay | Admitting: Physician Assistant

## 2021-04-05 ENCOUNTER — Ambulatory Visit (INDEPENDENT_AMBULATORY_CARE_PROVIDER_SITE_OTHER): Payer: Medicaid Other | Admitting: Physician Assistant

## 2021-04-05 VITALS — BP 134/76 | HR 67 | Ht 65.0 in | Wt 148.0 lb

## 2021-04-05 DIAGNOSIS — F411 Generalized anxiety disorder: Secondary | ICD-10-CM | POA: Insufficient documentation

## 2021-04-05 DIAGNOSIS — F3132 Bipolar disorder, current episode depressed, moderate: Secondary | ICD-10-CM | POA: Insufficient documentation

## 2021-04-05 MED ORDER — ESCITALOPRAM OXALATE 10 MG PO TABS: 10.0000 mg | ORAL_TABLET | Freq: Every day | ORAL | 1 refills | Status: DC

## 2021-04-05 MED ORDER — ARIPIPRAZOLE 5 MG PO TABS: 5.0000 mg | ORAL_TABLET | Freq: Every day | ORAL | 1 refills | Status: DC
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
## 2021-04-19 ENCOUNTER — Other Ambulatory Visit (HOSPITAL_COMMUNITY): Payer: Self-pay

## 2021-04-21 ENCOUNTER — Other Ambulatory Visit (HOSPITAL_COMMUNITY): Payer: Self-pay

## 2021-04-26 ENCOUNTER — Other Ambulatory Visit: Payer: Self-pay | Admitting: Internal Medicine

## 2021-04-26 ENCOUNTER — Telehealth: Payer: Self-pay | Admitting: *Deleted

## 2021-04-26 DIAGNOSIS — N529 Male erectile dysfunction, unspecified: Secondary | ICD-10-CM

## 2021-04-26 NOTE — Telephone Encounter (Signed)
RN returned call. Patient would like to be referred to urology. This problem has been present for "a while." Since it is now is causing issues in his marriage, he is asking for the referral. He has not connected with primary care, but will call locally to see if he can establish care. This can take months with his insurance - he is asking for the urology referral in the mean time. Andree Coss, RN

## 2021-04-26 NOTE — Telephone Encounter (Signed)
-----   Message from [facility] sent at 04/25/2021  4:18 PM EDT ----- Patient called said he is a [name] patient and would like to get a referral to see the neurologist because he is having issues when he is having Sex. Said he has no feeling and does not reach point. Best contact number is [phone]

## 2021-05-02 ENCOUNTER — Other Ambulatory Visit (HOSPITAL_COMMUNITY): Payer: Self-pay

## 2021-05-03 ENCOUNTER — Other Ambulatory Visit (HOSPITAL_COMMUNITY): Payer: Self-pay

## 2021-05-03 MED FILL — Dolutegravir Sodium Tab 50 MG (Base Equiv): ORAL | 30 days supply | Qty: 30 | Fill #1 | Status: AC

## 2021-05-03 MED FILL — Darunavir-Cobic-Emtricitab-Tenofov AF Tab 800-150-200-10 MG: ORAL | 30 days supply | Qty: 30 | Fill #1 | Status: AC

## 2021-05-19 ENCOUNTER — Encounter (HOSPITAL_COMMUNITY): Payer: Self-pay

## 2021-05-19 ENCOUNTER — Other Ambulatory Visit: Payer: Self-pay | Admitting: Internal Medicine

## 2021-05-19 ENCOUNTER — Encounter (HOSPITAL_COMMUNITY): Payer: Medicaid Other | Admitting: Physician Assistant

## 2021-05-19 ENCOUNTER — Telehealth (HOSPITAL_COMMUNITY): Payer: Self-pay | Admitting: *Deleted

## 2021-05-19 ENCOUNTER — Other Ambulatory Visit: Payer: Medicaid Other

## 2021-05-19 DIAGNOSIS — B20 Human immunodeficiency virus [HIV] disease: Secondary | ICD-10-CM

## 2021-05-19 NOTE — Telephone Encounter (Signed)
Received a rx request for patients Escitalopram . Will not fill it at this time, patient no showed for his appt this am with provider.

## 2021-05-22 ENCOUNTER — Other Ambulatory Visit: Payer: Medicaid Other

## 2021-05-26 ENCOUNTER — Other Ambulatory Visit: Payer: Self-pay

## 2021-05-26 ENCOUNTER — Other Ambulatory Visit: Payer: Medicaid Other

## 2021-05-26 DIAGNOSIS — Z113 Encounter for screening for infections with a predominantly sexual mode of transmission: Secondary | ICD-10-CM

## 2021-05-26 DIAGNOSIS — Z79899 Other long term (current) drug therapy: Secondary | ICD-10-CM

## 2021-05-26 DIAGNOSIS — B20 Human immunodeficiency virus [HIV] disease: Secondary | ICD-10-CM

## 2021-05-30 LAB — COMPLETE METABOLIC PANEL WITH GFR
AG Ratio: 1.9 (calc) (ref 1.0–2.5)
ALT: 27 U/L (ref 9–46)
AST: 25 U/L (ref 10–40)
Albumin: 4.5 g/dL (ref 3.6–5.1)
Alkaline phosphatase (APISO): 65 U/L (ref 36–130)
BUN: 24 mg/dL (ref 7–25)
CO2: 28 mmol/L (ref 20–32)
Calcium: 9.9 mg/dL (ref 8.6–10.3)
Chloride: 106 mmol/L (ref 98–110)
Creat: 1.17 mg/dL (ref 0.60–1.29)
Globulin: 2.4 g/dL (calc) (ref 1.9–3.7)
Glucose, Bld: 124 mg/dL — ABNORMAL HIGH (ref 65–99)
Potassium: 4.2 mmol/L (ref 3.5–5.3)
Sodium: 140 mmol/L (ref 135–146)
Total Bilirubin: 0.5 mg/dL (ref 0.2–1.2)
Total Protein: 6.9 g/dL (ref 6.1–8.1)
eGFR: 76 mL/min/{1.73_m2} (ref >=60)

## 2021-05-30 LAB — CBC WITH DIFFERENTIAL/PLATELET
Absolute Monocytes: 922 cells/uL (ref 200–950)
Basophils Absolute: 38 cells/uL (ref 0–200)
Basophils Relative: 0.4 %
Eosinophils Absolute: 259 cells/uL (ref 15–500)
Eosinophils Relative: 2.7 %
HCT: 45.7 % (ref 38.5–50.0)
Hemoglobin: 15.8 g/dL (ref 13.2–17.1)
Lymphs Abs: 1104 cells/uL (ref 850–3900)
MCH: 35 pg — ABNORMAL HIGH (ref 27.0–33.0)
MCHC: 34.6 g/dL (ref 32.0–36.0)
MCV: 101.1 fL — ABNORMAL HIGH (ref 80.0–100.0)
MPV: 9.7 fL (ref 7.5–12.5)
Monocytes Relative: 9.6 %
Neutro Abs: 7277 cells/uL (ref 1500–7800)
Neutrophils Relative %: 75.8 %
Platelets: 252 10*3/uL (ref 140–400)
RBC: 4.52 10*6/uL (ref 4.20–5.80)
RDW: 12.1 % (ref 11.0–15.0)
Total Lymphocyte: 11.5 %
WBC: 9.6 10*3/uL (ref 3.8–10.8)

## 2021-05-30 LAB — LIPID PANEL
Cholesterol: 298 mg/dL — ABNORMAL HIGH (ref <200)
HDL: 56 mg/dL (ref >=40)
LDL Cholesterol (Calc): 209 mg/dL (calc) — ABNORMAL HIGH
Non-HDL Cholesterol (Calc): 242 mg/dL (calc) — ABNORMAL HIGH (ref <130)
Total CHOL/HDL Ratio: 5.3 (calc) — ABNORMAL HIGH (ref <5.0)
Triglycerides: 160 mg/dL — ABNORMAL HIGH (ref <150)

## 2021-05-30 LAB — T-HELPER CELLS (CD4) COUNT (NOT AT ARMC)
Absolute CD4: 495 cells/uL (ref 490–1740)
CD4 T Helper %: 40 % (ref 30–61)
Total lymphocyte count: 1247 cells/uL (ref 850–3900)

## 2021-05-30 LAB — RPR: RPR Ser Ql: NONREACTIVE

## 2021-05-30 LAB — HIV-1 RNA QUANT-NO REFLEX-BLD
HIV 1 RNA Quant: <20 Copies/mL — ABNORMAL HIGH
HIV-1 RNA Quant, Log: <1.3 Log cps/mL — ABNORMAL HIGH

## 2021-05-31 ENCOUNTER — Other Ambulatory Visit (HOSPITAL_COMMUNITY): Payer: Self-pay

## 2021-06-02 ENCOUNTER — Other Ambulatory Visit (HOSPITAL_COMMUNITY): Payer: Self-pay

## 2021-06-05 ENCOUNTER — Encounter: Payer: Self-pay | Admitting: Internal Medicine

## 2021-06-05 ENCOUNTER — Other Ambulatory Visit: Payer: Self-pay

## 2021-06-05 ENCOUNTER — Ambulatory Visit (INDEPENDENT_AMBULATORY_CARE_PROVIDER_SITE_OTHER): Payer: Medicaid Other | Admitting: Internal Medicine

## 2021-06-05 VITALS — BP 117/71 | HR 89 | Temp 98.3°F | Wt 154.8 lb

## 2021-06-05 DIAGNOSIS — Z113 Encounter for screening for infections with a predominantly sexual mode of transmission: Secondary | ICD-10-CM

## 2021-06-05 DIAGNOSIS — F17200 Nicotine dependence, unspecified, uncomplicated: Secondary | ICD-10-CM | POA: Diagnosis not present

## 2021-06-05 DIAGNOSIS — Z79899 Other long term (current) drug therapy: Secondary | ICD-10-CM

## 2021-06-05 DIAGNOSIS — B20 Human immunodeficiency virus [HIV] disease: Secondary | ICD-10-CM | POA: Diagnosis not present

## 2021-06-05 MED ORDER — VARENICLINE TARTRATE 0.5 MG X 11 & 1 MG X 42 PO MISC: ORAL | 2 refills | Status: DC

## 2021-06-05 NOTE — Assessment & Plan Note (Signed)
Lipid panel noted and I have referred him to a PCP

## 2021-06-05 NOTE — Assessment & Plan Note (Signed)
He continues to do well with his salvage regimen with no concerns.  Will continue with the same and he can rtc in 6 months.

## 2021-06-05 NOTE — Assessment & Plan Note (Signed)
I discussed smoking cessation with him and will start chantix.

## 2021-06-05 NOTE — Progress Notes (Signed)
   Subjective:    Patient ID: Garrett Schroeder, male    DOB: 1970-11-11, 50 y.o.   MRN: [id number]  HPI Here for follow up of HIV He continues on Symtuza and tivicay and no missed doses.  CD4 495 and viral load <20.  No issues with obtaining, taking or tolerating the medication.  Interested in quitting smoking.    Review of Systems  Constitutional:  Negative for fatigue.  Gastrointestinal:  Negative for diarrhea.  Skin:  Negative for rash.  Psychiatric/Behavioral:  Negative for sleep disturbance.       Objective:   Physical Exam Eyes:     General: No scleral icterus. Pulmonary:     Effort: Pulmonary effort is normal.  Neurological:     Mental Status: He is alert.  Psychiatric:        Mood and Affect: Mood normal.    SH; + tobacco      Assessment & Plan:

## 2021-06-05 NOTE — Assessment & Plan Note (Signed)
Screened negative 

## 2021-06-23 ENCOUNTER — Other Ambulatory Visit (HOSPITAL_COMMUNITY): Payer: Self-pay

## 2021-06-26 ENCOUNTER — Other Ambulatory Visit (HOSPITAL_COMMUNITY): Payer: Self-pay

## 2021-06-26 MED FILL — Darunavir-Cobic-Emtricitab-Tenofov AF Tab 800-150-200-10 MG: ORAL | 30 days supply | Qty: 30 | Fill #2 | Status: AC

## 2021-06-26 MED FILL — Dolutegravir Sodium Tab 50 MG (Base Equiv): ORAL | 30 days supply | Qty: 30 | Fill #2 | Status: AC

## 2021-06-29 ENCOUNTER — Other Ambulatory Visit (HOSPITAL_COMMUNITY): Payer: Self-pay

## 2021-07-24 ENCOUNTER — Other Ambulatory Visit (HOSPITAL_COMMUNITY): Payer: Self-pay

## 2021-07-24 MED FILL — Dolutegravir Sodium Tab 50 MG (Base Equiv): ORAL | 30 days supply | Qty: 30 | Fill #3 | Status: AC

## 2021-07-24 MED FILL — Darunavir-Cobic-Emtricitab-Tenofov AF Tab 800-150-200-10 MG: ORAL | 30 days supply | Qty: 30 | Fill #3 | Status: AC

## 2021-08-21 ENCOUNTER — Other Ambulatory Visit (HOSPITAL_COMMUNITY): Payer: Self-pay

## 2021-09-04 ENCOUNTER — Other Ambulatory Visit: Payer: Self-pay | Admitting: Internal Medicine

## 2021-09-04 ENCOUNTER — Other Ambulatory Visit (HOSPITAL_COMMUNITY): Payer: Self-pay

## 2021-09-04 DIAGNOSIS — B20 Human immunodeficiency virus [HIV] disease: Secondary | ICD-10-CM

## 2021-09-05 ENCOUNTER — Other Ambulatory Visit (HOSPITAL_COMMUNITY): Payer: Self-pay

## 2021-09-05 MED ORDER — TIVICAY 50 MG PO TABS
ORAL_TABLET | Freq: Every day | ORAL | 3 refills | Status: DC
  Filled 2021-09-05: qty 30, 30d supply, fill #0
  Filled 2021-10-06: qty 30, 30d supply, fill #1
  Filled 2021-10-25: qty 30, 30d supply, fill #2
  Filled 2021-12-20: qty 30, 30d supply, fill #3
  Filled 2021-12-20: qty 30, 30d supply, fill #0

## 2021-09-05 MED ORDER — SYMTUZA 800-150-200-10 MG PO TABS
1.0000 | ORAL_TABLET | Freq: Every day | ORAL | 3 refills | Status: DC
  Filled 2021-09-05: qty 30, 30d supply, fill #0
  Filled 2021-10-06: qty 30, 30d supply, fill #1
  Filled 2021-10-25: qty 30, 30d supply, fill #2
  Filled 2021-12-20: qty 30, 30d supply, fill #3
  Filled 2021-12-20: qty 30, 30d supply, fill #0

## 2021-09-25 ENCOUNTER — Other Ambulatory Visit (HOSPITAL_COMMUNITY): Payer: Self-pay

## 2021-09-26 ENCOUNTER — Other Ambulatory Visit (HOSPITAL_COMMUNITY): Payer: Self-pay

## 2021-09-29 ENCOUNTER — Other Ambulatory Visit (HOSPITAL_COMMUNITY): Payer: Self-pay

## 2021-10-02 ENCOUNTER — Other Ambulatory Visit (HOSPITAL_COMMUNITY): Payer: Self-pay

## 2021-10-06 ENCOUNTER — Other Ambulatory Visit (HOSPITAL_COMMUNITY): Payer: Self-pay

## 2021-10-23 ENCOUNTER — Other Ambulatory Visit (HOSPITAL_COMMUNITY): Payer: Self-pay

## 2021-10-25 ENCOUNTER — Other Ambulatory Visit (HOSPITAL_COMMUNITY): Payer: Self-pay

## 2021-10-31 ENCOUNTER — Other Ambulatory Visit (HOSPITAL_COMMUNITY): Payer: Self-pay

## 2021-11-22 ENCOUNTER — Other Ambulatory Visit (HOSPITAL_COMMUNITY): Payer: Self-pay

## 2021-11-24 ENCOUNTER — Other Ambulatory Visit (HOSPITAL_COMMUNITY): Payer: Self-pay

## 2021-12-01 ENCOUNTER — Other Ambulatory Visit (HOSPITAL_COMMUNITY): Payer: Self-pay

## 2021-12-06 ENCOUNTER — Other Ambulatory Visit: Payer: Medicaid Other

## 2021-12-18 ENCOUNTER — Other Ambulatory Visit (HOSPITAL_COMMUNITY): Payer: Self-pay

## 2021-12-20 ENCOUNTER — Encounter: Payer: Medicaid Other | Admitting: Internal Medicine

## 2021-12-20 ENCOUNTER — Other Ambulatory Visit (HOSPITAL_COMMUNITY): Payer: Self-pay

## 2021-12-20 ENCOUNTER — Other Ambulatory Visit: Payer: Self-pay

## 2021-12-27 ENCOUNTER — Other Ambulatory Visit: Payer: Medicaid Other

## 2022-01-01 ENCOUNTER — Other Ambulatory Visit: Payer: Medicaid Other

## 2022-01-01 ENCOUNTER — Other Ambulatory Visit: Payer: Self-pay

## 2022-01-01 DIAGNOSIS — Z113 Encounter for screening for infections with a predominantly sexual mode of transmission: Secondary | ICD-10-CM

## 2022-01-01 DIAGNOSIS — B20 Human immunodeficiency virus [HIV] disease: Secondary | ICD-10-CM

## 2022-01-10 ENCOUNTER — Encounter: Payer: Medicaid Other | Admitting: Internal Medicine

## 2022-01-15 ENCOUNTER — Other Ambulatory Visit (HOSPITAL_COMMUNITY): Payer: Self-pay

## 2022-01-17 ENCOUNTER — Other Ambulatory Visit (HOSPITAL_COMMUNITY): Payer: Self-pay

## 2022-01-29 ENCOUNTER — Other Ambulatory Visit: Payer: Self-pay | Admitting: Internal Medicine

## 2022-01-29 ENCOUNTER — Other Ambulatory Visit (HOSPITAL_COMMUNITY): Payer: Self-pay

## 2022-01-29 DIAGNOSIS — B20 Human immunodeficiency virus [HIV] disease: Secondary | ICD-10-CM

## 2022-01-29 NOTE — Telephone Encounter (Signed)
Patient scheduled 3/30

## 2022-01-30 ENCOUNTER — Other Ambulatory Visit (HOSPITAL_COMMUNITY): Payer: Self-pay

## 2022-01-31 ENCOUNTER — Other Ambulatory Visit (HOSPITAL_COMMUNITY): Payer: Self-pay

## 2022-01-31 ENCOUNTER — Other Ambulatory Visit: Payer: Self-pay | Admitting: Internal Medicine

## 2022-01-31 DIAGNOSIS — B20 Human immunodeficiency virus [HIV] disease: Secondary | ICD-10-CM

## 2022-01-31 NOTE — Telephone Encounter (Signed)
Pending appointment on 3/30 ?

## 2022-01-31 NOTE — Telephone Encounter (Signed)
Pending appointment on 02/01/2022. ? ?

## 2022-02-01 ENCOUNTER — Other Ambulatory Visit: Payer: Self-pay

## 2022-02-01 ENCOUNTER — Other Ambulatory Visit (HOSPITAL_COMMUNITY): Payer: Self-pay

## 2022-02-01 ENCOUNTER — Ambulatory Visit: Payer: Medicaid Other | Admitting: Internal Medicine

## 2022-02-01 ENCOUNTER — Encounter: Payer: Self-pay | Admitting: Internal Medicine

## 2022-02-01 ENCOUNTER — Ambulatory Visit (INDEPENDENT_AMBULATORY_CARE_PROVIDER_SITE_OTHER): Payer: Medicaid Other | Admitting: Internal Medicine

## 2022-02-01 VITALS — BP 103/66 | HR 85 | Temp 98.8°F | Resp 16 | Ht 66.25 in | Wt 171.0 lb

## 2022-02-01 DIAGNOSIS — B20 Human immunodeficiency virus [HIV] disease: Secondary | ICD-10-CM

## 2022-02-01 DIAGNOSIS — F3132 Bipolar disorder, current episode depressed, moderate: Secondary | ICD-10-CM | POA: Diagnosis not present

## 2022-02-01 DIAGNOSIS — F17200 Nicotine dependence, unspecified, uncomplicated: Secondary | ICD-10-CM | POA: Diagnosis not present

## 2022-02-01 DIAGNOSIS — Z5181 Encounter for therapeutic drug level monitoring: Secondary | ICD-10-CM | POA: Diagnosis not present

## 2022-02-01 MED ORDER — SYMTUZA 800-150-200-10 MG PO TABS
1.0000 | ORAL_TABLET | Freq: Every day | ORAL | 11 refills | Status: DC
  Filled 2022-02-01: qty 30, 30d supply, fill #0
  Filled 2022-03-01: qty 30, 30d supply, fill #1
  Filled 2022-04-25: qty 30, 30d supply, fill #2
  Filled 2022-05-16: qty 30, 30d supply, fill #3

## 2022-02-01 MED ORDER — TIVICAY 50 MG PO TABS: ORAL_TABLET | Freq: Every day | ORAL | 11 refills | Status: DC

## 2022-02-01 MED ORDER — TIVICAY 50 MG PO TABS
ORAL_TABLET | Freq: Every day | ORAL | 11 refills | Status: DC
  Filled 2022-02-01: qty 30, 30d supply, fill #0
  Filled 2022-03-01: qty 30, 30d supply, fill #1
  Filled 2022-04-25: qty 30, 30d supply, fill #2
  Filled 2022-05-16: qty 30, 30d supply, fill #3

## 2022-02-01 MED ORDER — SYMTUZA 800-150-200-10 MG PO TABS: 1.0000 | ORAL_TABLET | Freq: Every day | ORAL | 11 refills | Status: DC

## 2022-02-01 NOTE — Progress Notes (Signed)
? ?  Subjective:  ? ? Patient ID: Garrett Schroeder, male    DOB: October 14, 1971, 51 y.o.   MRN: [id number] ? ?HPI ?Here for follow up of HIV ?He continues on [place] with occasional missed doses.  He has remained suppressed and with a good CD4 count.  No labs prior to today's visit.  Feels run down today.  Working in Aeronautical engineer now.  Some weight gain he attributes to his daily beer intake.   ? ? ?Review of Systems  ?Constitutional:  Negative for appetite change.  ?Respiratory:  Positive for cough.   ?Gastrointestinal:  Negative for diarrhea and nausea.  ?Skin:  Negative for rash.  ? ?   ?Objective:  ? Physical Exam ?Eyes:  ?   General: No scleral icterus. ?Skin: ?   Findings: No rash.  ?Neurological:  ?   General: No focal deficit present.  ?   Mental Status: He is alert.  ?Psychiatric:     ?   Mood and Affect: Mood normal.  ? ?SH: + tobacco ? ? ? ?   ?Assessment & Plan:  ? ? ?

## 2022-02-01 NOTE — Assessment & Plan Note (Signed)
He is doing well and will confirm with labs today.  I encouraged not to miss any doses.  rtc in 6 months.  ?

## 2022-02-01 NOTE — Assessment & Plan Note (Signed)
Getting care at [facility] and on Abilify and Lexapro.   ?Checking CMP, CBC today ?

## 2022-02-01 NOTE — Assessment & Plan Note (Signed)
Wanting to quit and encouraged.   ?

## 2022-02-04 LAB — T-HELPER CELL (CD4) - (RCID CLINIC ONLY)
CD4 % Helper T Cell: 34 % (ref 33–65)
CD4 T Cell Abs: 315 /uL — ABNORMAL LOW (ref 400–1790)

## 2022-02-05 LAB — COMPLETE METABOLIC PANEL WITH GFR
AG Ratio: 1.8 (calc) (ref 1.0–2.5)
ALT: 28 U/L (ref 9–46)
AST: 27 U/L (ref 10–40)
Albumin: 4.4 g/dL (ref 3.6–5.1)
Alkaline phosphatase (APISO): 69 U/L (ref 36–130)
BUN: 19 mg/dL (ref 7–25)
CO2: 26 mmol/L (ref 20–32)
Calcium: 9.6 mg/dL (ref 8.6–10.3)
Chloride: 105 mmol/L (ref 98–110)
Creat: 1.15 mg/dL (ref 0.60–1.29)
Globulin: 2.5 g/dL (calc) (ref 1.9–3.7)
Glucose, Bld: 117 mg/dL — ABNORMAL HIGH (ref 65–99)
Potassium: 4.2 mmol/L (ref 3.5–5.3)
Sodium: 139 mmol/L (ref 135–146)
Total Bilirubin: 1.1 mg/dL (ref 0.2–1.2)
Total Protein: 6.9 g/dL (ref 6.1–8.1)
eGFR: 78 mL/min/{1.73_m2} (ref >=60)

## 2022-02-05 LAB — CBC WITH DIFFERENTIAL/PLATELET
Absolute Monocytes: 1216 cells/uL — ABNORMAL HIGH (ref 200–950)
Basophils Absolute: 38 cells/uL (ref 0–200)
Basophils Relative: 0.3 %
Eosinophils Absolute: 205 cells/uL (ref 15–500)
Eosinophils Relative: 1.6 %
HCT: 47.3 % (ref 38.5–50.0)
Hemoglobin: 16.5 g/dL (ref 13.2–17.1)
Lymphs Abs: 998 cells/uL (ref 850–3900)
MCH: 34.2 pg — ABNORMAL HIGH (ref 27.0–33.0)
MCHC: 34.9 g/dL (ref 32.0–36.0)
MCV: 97.9 fL (ref 80.0–100.0)
MPV: 9.9 fL (ref 7.5–12.5)
Monocytes Relative: 9.5 %
Neutro Abs: 10342 cells/uL — ABNORMAL HIGH (ref 1500–7800)
Neutrophils Relative %: 80.8 %
Platelets: 262 10*3/uL (ref 140–400)
RBC: 4.83 10*6/uL (ref 4.20–5.80)
RDW: 12.5 % (ref 11.0–15.0)
Total Lymphocyte: 7.8 %
WBC: 12.8 10*3/uL — ABNORMAL HIGH (ref 3.8–10.8)

## 2022-02-05 LAB — HIV-1 RNA QUANT-NO REFLEX-BLD
HIV 1 RNA Quant: NOT DETECTED Copies/mL
HIV-1 RNA Quant, Log: NOT DETECTED Log cps/mL

## 2022-02-27 ENCOUNTER — Other Ambulatory Visit (HOSPITAL_COMMUNITY): Payer: Self-pay

## 2022-03-01 ENCOUNTER — Other Ambulatory Visit (HOSPITAL_COMMUNITY): Payer: Self-pay

## 2022-03-02 ENCOUNTER — Other Ambulatory Visit (HOSPITAL_COMMUNITY): Payer: Self-pay

## 2022-03-26 ENCOUNTER — Other Ambulatory Visit (HOSPITAL_COMMUNITY): Payer: Self-pay

## 2022-03-28 ENCOUNTER — Other Ambulatory Visit (HOSPITAL_COMMUNITY): Payer: Self-pay

## 2022-04-12 ENCOUNTER — Other Ambulatory Visit (HOSPITAL_COMMUNITY): Payer: Self-pay

## 2022-04-16 ENCOUNTER — Other Ambulatory Visit (HOSPITAL_COMMUNITY): Payer: Self-pay

## 2022-04-25 ENCOUNTER — Other Ambulatory Visit (HOSPITAL_COMMUNITY): Payer: Self-pay

## 2022-05-03 ENCOUNTER — Other Ambulatory Visit: Payer: Self-pay

## 2022-05-14 ENCOUNTER — Other Ambulatory Visit (HOSPITAL_COMMUNITY): Payer: Self-pay

## 2022-05-16 ENCOUNTER — Other Ambulatory Visit (HOSPITAL_COMMUNITY): Payer: Self-pay

## 2022-05-22 ENCOUNTER — Other Ambulatory Visit (HOSPITAL_COMMUNITY): Payer: Self-pay

## 2022-06-12 ENCOUNTER — Other Ambulatory Visit: Payer: Self-pay

## 2022-06-12 ENCOUNTER — Other Ambulatory Visit (HOSPITAL_COMMUNITY): Payer: Self-pay

## 2022-06-14 ENCOUNTER — Other Ambulatory Visit (HOSPITAL_COMMUNITY): Payer: Self-pay

## 2022-06-18 ENCOUNTER — Other Ambulatory Visit (HOSPITAL_COMMUNITY): Payer: Self-pay

## 2022-06-18 ENCOUNTER — Other Ambulatory Visit: Payer: Self-pay

## 2022-06-18 DIAGNOSIS — Z79899 Other long term (current) drug therapy: Secondary | ICD-10-CM

## 2022-06-18 DIAGNOSIS — Z113 Encounter for screening for infections with a predominantly sexual mode of transmission: Secondary | ICD-10-CM

## 2022-06-18 DIAGNOSIS — B20 Human immunodeficiency virus [HIV] disease: Secondary | ICD-10-CM

## 2022-06-19 ENCOUNTER — Other Ambulatory Visit: Payer: Medicaid Other

## 2022-06-19 ENCOUNTER — Other Ambulatory Visit: Payer: Self-pay

## 2022-06-19 DIAGNOSIS — Z79899 Other long term (current) drug therapy: Secondary | ICD-10-CM

## 2022-06-19 DIAGNOSIS — B20 Human immunodeficiency virus [HIV] disease: Secondary | ICD-10-CM

## 2022-06-19 DIAGNOSIS — Z113 Encounter for screening for infections with a predominantly sexual mode of transmission: Secondary | ICD-10-CM

## 2022-06-20 ENCOUNTER — Telehealth: Payer: Self-pay

## 2022-06-20 LAB — T-HELPER CELL (CD4) - (RCID CLINIC ONLY)
CD4 % Helper T Cell: 26 % — ABNORMAL LOW (ref 33–65)
CD4 T Cell Abs: 332 /uL — ABNORMAL LOW (ref 400–1790)

## 2022-06-20 NOTE — Telephone Encounter (Signed)
Patient called office today to check status on labs. States he does not have access to [employer]. Reviewed CD4 with patient and informed him that RPR and VL were still resulting. Patient states during call that he is not taking his medication and that he is on a suicide mission before ending call. Called [place] emergency services to do welfare check. Called them at [phone] and informed them on what happened. Should be getting call back.  Clinical Lead called crisis line and requested they follow up with patient. Should be getting call back with update.  Juanita Laster, RMA

## 2022-06-20 NOTE — Telephone Encounter (Signed)
Patient called wanting to discuss lab results. Relayed that viral load, CD4, and RPR are not back yet, but that lipid panel, CBC, and CMP were normal.   Sandie Ano, RN

## 2022-06-21 NOTE — Telephone Encounter (Signed)
Patient called wanting to know if VL was back, advised that it was drawn on the 15th and can take up to 7 days to result. Patient verbalized understanding and has no further questions.   Sandie Ano, RN

## 2022-06-21 NOTE — Telephone Encounter (Signed)
Patient called to discuss lab results. VL currently pending. Staff also follow up on patients feelings of expressing suicidal thoughts yesterday. Patient stated that his wife has left him and he was feeling very low yesterday, states he feels fine today and denies any feelings of wanting to harm himself. Advised patient that he can call or text [phone] if he felt like he was having a crisis. Will also send other resources via Mychart.  Valarie Cones

## 2022-06-22 ENCOUNTER — Telehealth: Payer: Self-pay

## 2022-06-22 LAB — COMPLETE METABOLIC PANEL WITH GFR
AG Ratio: 1.5 (calc) (ref 1.0–2.5)
ALT: 33 U/L (ref 9–46)
AST: 27 U/L (ref 10–35)
Albumin: 4 g/dL (ref 3.6–5.1)
Alkaline phosphatase (APISO): 91 U/L (ref 35–144)
BUN: 16 mg/dL (ref 7–25)
CO2: 24 mmol/L (ref 20–32)
Calcium: 9.2 mg/dL (ref 8.6–10.3)
Chloride: 108 mmol/L (ref 98–110)
Creat: 1.08 mg/dL (ref 0.70–1.30)
Globulin: 2.6 g/dL (calc) (ref 1.9–3.7)
Glucose, Bld: 86 mg/dL (ref 65–99)
Potassium: 3.7 mmol/L (ref 3.5–5.3)
Sodium: 142 mmol/L (ref 135–146)
Total Bilirubin: 0.5 mg/dL (ref 0.2–1.2)
Total Protein: 6.6 g/dL (ref 6.1–8.1)
eGFR: 84 mL/min/{1.73_m2} (ref >=60)

## 2022-06-22 LAB — CBC WITH DIFFERENTIAL/PLATELET
Absolute Monocytes: 801 cells/uL (ref 200–950)
Basophils Absolute: 31 cells/uL (ref 0–200)
Basophils Relative: 0.6 %
Eosinophils Absolute: 51 cells/uL (ref 15–500)
Eosinophils Relative: 1 %
HCT: 46.5 % (ref 38.5–50.0)
Hemoglobin: 16.3 g/dL (ref 13.2–17.1)
Lymphs Abs: 1331 cells/uL (ref 850–3900)
MCH: 32.5 pg (ref 27.0–33.0)
MCHC: 35.1 g/dL (ref 32.0–36.0)
MCV: 92.6 fL (ref 80.0–100.0)
MPV: 9.6 fL (ref 7.5–12.5)
Monocytes Relative: 15.7 %
Neutro Abs: 2887 cells/uL (ref 1500–7800)
Neutrophils Relative %: 56.6 %
Platelets: 261 10*3/uL (ref 140–400)
RBC: 5.02 10*6/uL (ref 4.20–5.80)
RDW: 11.7 % (ref 11.0–15.0)
Total Lymphocyte: 26.1 %
WBC: 5.1 10*3/uL (ref 3.8–10.8)

## 2022-06-22 LAB — LIPID PANEL
Cholesterol: 152 mg/dL (ref <200)
HDL: 34 mg/dL — ABNORMAL LOW (ref >=40)
LDL Cholesterol (Calc): 94 mg/dL (calc)
Non-HDL Cholesterol (Calc): 118 mg/dL (calc) (ref <130)
Total CHOL/HDL Ratio: 4.5 (calc) (ref <5.0)
Triglycerides: 143 mg/dL (ref <150)

## 2022-06-22 LAB — RPR: RPR Ser Ql: NONREACTIVE

## 2022-06-22 LAB — HIV-1 RNA QUANT-NO REFLEX-BLD
HIV 1 RNA Quant: 86400 Copies/mL — ABNORMAL HIGH
HIV-1 RNA Quant, Log: 4.94 Log cps/mL — ABNORMAL HIGH

## 2022-06-22 NOTE — Telephone Encounter (Signed)
Patient called today again requesting VL results. Patient advised that it will take at least to weeks for results to come back. I also advised him that he has an appointment on 07/03/22 and all results will be discussed with him then unless there is something urgent we need to discuss with him before his appointment. Lucindy Borel T Pricilla Loveless

## 2022-06-22 NOTE — Telephone Encounter (Signed)
Patient called back regarding VL result. Informed him that provider would review these results with him at his upcoming appointment. Patient kept asking for results and advised that provider would discuss results in person at upcoming appointment.  Patient ended  call. Juanita Laster, RMA

## 2022-06-22 NOTE — Telephone Encounter (Signed)
Patient has called several times regarding his VL results. Patient has also been advised the results will be discussed at his appointment and patient has had several cancellations and no shows. I have moved patient's appointment to 06/25/22 to see Tammy Sours to discuss his results and psoriasis. [name] has no sooner availability. Tammy Sours see note below regarding patient's psoriasis please.  Kessie Croston T Pricilla Loveless

## 2022-06-22 NOTE — Telephone Encounter (Signed)
Patient called stating he would like for something sent into pharmacy for psoriasis. Is having outbreak all over his body. Would like cream and shampoo sent [facility] in [place], [place].  Will forward message to Md.  Juanita Laster, RMA

## 2022-06-25 ENCOUNTER — Ambulatory Visit: Payer: Medicaid Other | Admitting: Family

## 2022-06-26 ENCOUNTER — Other Ambulatory Visit (HOSPITAL_COMMUNITY): Payer: Self-pay

## 2022-06-26 ENCOUNTER — Other Ambulatory Visit: Payer: Self-pay | Admitting: Family

## 2022-06-26 ENCOUNTER — Encounter: Payer: Self-pay | Admitting: Family

## 2022-06-26 ENCOUNTER — Other Ambulatory Visit: Payer: Self-pay

## 2022-06-26 ENCOUNTER — Ambulatory Visit (INDEPENDENT_AMBULATORY_CARE_PROVIDER_SITE_OTHER): Payer: Medicaid Other | Admitting: Family

## 2022-06-26 VITALS — BP 101/66 | HR 92 | Temp 98.3°F | Wt 136.0 lb

## 2022-06-26 DIAGNOSIS — B20 Human immunodeficiency virus [HIV] disease: Secondary | ICD-10-CM

## 2022-06-26 DIAGNOSIS — L409 Psoriasis, unspecified: Secondary | ICD-10-CM | POA: Insufficient documentation

## 2022-06-26 DIAGNOSIS — F3132 Bipolar disorder, current episode depressed, moderate: Secondary | ICD-10-CM | POA: Diagnosis not present

## 2022-06-26 MED ORDER — ARIPIPRAZOLE 10 MG PO TABS: 10.0000 mg | ORAL_TABLET | Freq: Every day | ORAL | 2 refills | Status: DC

## 2022-06-26 MED ORDER — CLOBETASOL PROPIONATE 0.025 % EX CREA: 1.0000 | TOPICAL_CREAM | Freq: Two times a day (BID) | CUTANEOUS | 2 refills | Status: DC

## 2022-06-26 MED ORDER — PREDNISONE 10 MG (21) PO TBPK: ORAL_TABLET | ORAL | 0 refills | Status: AC

## 2022-06-26 MED ORDER — SYMTUZA 800-150-200-10 MG PO TABS
1.0000 | ORAL_TABLET | Freq: Every day | ORAL | 2 refills | Status: DC
  Filled 2022-06-26: qty 30, 30d supply, fill #0

## 2022-06-26 MED ORDER — CLOBETASOL PROPIONATE 0.05 % EX SHAM: MEDICATED_SHAMPOO | CUTANEOUS | 1 refills | Status: AC

## 2022-06-26 MED ORDER — ESCITALOPRAM OXALATE 20 MG PO TABS: 20.0000 mg | ORAL_TABLET | Freq: Every day | ORAL | 2 refills | Status: DC

## 2022-06-26 NOTE — Telephone Encounter (Signed)
Medication is not covered, is there an alternate we can try?

## 2022-06-26 NOTE — Telephone Encounter (Signed)
New medication sent to pharmacy

## 2022-06-26 NOTE — Assessment & Plan Note (Signed)
Garrett Schroeder has exacerbation of his depression triggered by separating from his wife with severity that has led him to waxing and waning suicidal ideation with none currently. Have recommended counseling and provided Emergency Resources in his AVS and advised to seek care at the ED or through one of these sources if his symptoms worsen or thoughts of suicide with plan develop.

## 2022-06-26 NOTE — Assessment & Plan Note (Signed)
Garrett Schroeder has poorly controlled virus with viral load of 84,000 secondary to being off medication as a result of depression triggered by separation from his wife. Has restarted taking medication 2 days ago. Reviewed lab work and discussed plan of care. Continue current dose of Symtuza. Plan for follow up in 1 month or sooner if needed with [name] to ensure viral suppression.

## 2022-06-26 NOTE — Progress Notes (Signed)
Patient ID: Garrett Schroeder, male    DOB: [birth year], 51 y.o.   MRN: [id number]  Subjective:    Chief Complaint  Patient presents with   Follow-up    B20 - itchy burning lesions all over body x 2+ months.     HPI:  Garrett Schroeder is a 51 y.o. male with HIV disease and psoriasis last seen by [name] on 02/01/22 with well controlled virus and good adherence and tolerance to Symtuza. Viral load was undetectable and CD4 count 315. Presenting today for acute office visit.  Mr. Garrett Schroeder has been off all of his medications for the past 3-4 months secondary to depression resulting from separating from his wife. He did at one point have suicidal ideation but denies currently and this waxes and wanes. Previously on Abilify and Lexapro which helped to control his mood. Has had an outbreak of his psoriasis that is located all over. Was seen at an Urgent Care where he was prescribed Terbinafine for suspected candidal infection which he has completed and has not helped. Described as itchy and located everywhere. Appears he has been on corticosteroid creams as well as received Humira in the past and was followed by dermatology at one point. Has no problems obtaining medications and has restarted taking his Symtuza 2 days ago.    Allergies  Allergen Reactions   Codeine Itching      Outpatient Medications Prior to Visit  Medication Sig Dispense Refill   dolutegravir (TIVICAY) 50 MG tablet TAKE 1 TABLET (50 MG TOTAL) BY MOUTH DAILY. 30 tablet 11   Darunavir-Cobicistat-Emtricitabine-Tenofovir Alafenamide (SYMTUZA) 800-150-200-10 MG TABS TAKE 1 TABLET BY MOUTH DAILY WITH BREAKFAST. 30 tablet 11   Adalimumab 40 MG/0.4ML PNKT Inject 0.4 mLs into the skin every 14 (fourteen) days.     Ascorbic Acid (VITAMIN C PO) Take by mouth. (Patient not taking: Reported on 06/26/2022)     cyanocobalamin 1000 MCG tablet Take 1 tablet by mouth daily. (Patient not taking: Reported on 06/26/2022)     ibuprofen (ADVIL) 200 MG  tablet Take 2,000-2,400 mg by mouth daily as needed (pain). (Patient not taking: Reported on 06/26/2022)     meloxicam (MOBIC) 15 MG tablet Take 15 mg by mouth daily. (Patient not taking: Reported on 06/26/2022)     Multiple Vitamin (MULTIVITAMIN) tablet Take 1 tablet by mouth daily. (Patient not taking: Reported on 06/26/2022)     testosterone cypionate (DEPOTESTOSTERONE CYPIONATE) 200 MG/ML injection SMARTSIG:Milliliter(s) IM (Patient not taking: Reported on 06/26/2022)     ARIPiprazole (ABILIFY) 10 MG tablet Take 10 mg by mouth daily. (Patient not taking: Reported on 06/26/2022)     escitalopram (LEXAPRO) 20 MG tablet Take 20 mg by mouth daily. (Patient not taking: Reported on 06/26/2022)     No facility-administered medications prior to visit.     Past Medical History:  Diagnosis Date   HIV infection (HCC)    Psoriatic arthritis mutilans (HCC)      History reviewed. No pertinent surgical history.    Review of Systems  Constitutional:  Negative for appetite change, chills, fatigue, fever and unexpected weight change.  Eyes:  Negative for visual disturbance.  Respiratory:  Negative for cough, chest tightness, shortness of breath and wheezing.   Cardiovascular:  Negative for chest pain and leg swelling.  Gastrointestinal:  Negative for abdominal pain, constipation, diarrhea, nausea and vomiting.  Genitourinary:  Negative for dysuria, flank pain, frequency, genital sores, hematuria and urgency.  Skin:  Positive for rash.  Allergic/Immunologic: Negative for  immunocompromised state.  Neurological:  Negative for dizziness and headaches.      Objective:    BP 101/66   Pulse 92   Temp 98.3 F (36.8 C) (Oral)   Wt 136 lb (61.7 kg)   SpO2 92%   BMI 21.79 kg/m  Nursing note and vital signs reviewed.  Physical Exam Constitutional:      General: He is not in acute distress.    Appearance: He is well-developed.  Eyes:     Conjunctiva/sclera: Conjunctivae normal.  Cardiovascular:      Rate and Rhythm: Normal rate and regular rhythm.     Heart sounds: Normal heart sounds. No murmur heard.    No friction rub. No gallop.  Pulmonary:     Effort: Pulmonary effort is normal. No respiratory distress.     Breath sounds: Normal breath sounds. No wheezing or rales.  Chest:     Chest wall: No tenderness.  Abdominal:     General: Bowel sounds are normal. There is no distension.     Palpations: Abdomen is soft. There is no mass.     Tenderness: There is no abdominal tenderness. There is no guarding or rebound.  Musculoskeletal:     Cervical back: Neck supple.  Lymphadenopathy:     Cervical: No cervical adenopathy.  Skin:    General: Skin is warm and dry.     Findings: Rash present.  Neurological:     Mental Status: He is alert and oriented to person, place, and time.  Psychiatric:        Behavior: Behavior normal.        Thought Content: Thought content normal.        Judgment: Judgment normal.         06/26/2022   10:24 AM 02/01/2022    8:36 AM 04/05/2021   10:19 AM 04/19/2020    8:42 AM 03/23/2019    1:40 PM  Depression screen PHQ 2/9  Decreased Interest 3 0  0 0  Down, Depressed, Hopeless 3 0  0 0  PHQ - 2 Score 6 0  0 0  Altered sleeping       Tired, decreased energy       Change in appetite       Feeling bad or failure about yourself        Trouble concentrating       Moving slowly or fidgety/restless       Suicidal thoughts       PHQ-9 Score       Difficult doing work/chores          Information is confidential and restricted. Go to Review Flowsheets to unlock data.       Assessment & Plan:    Patient Active Problem List   Diagnosis Date Noted   Psoriasis 06/26/2022   Medication monitoring encounter 02/01/2022   Erectile dysfunction 04/26/2021   Bipolar affective disorder, currently depressed, moderate (HCC) 04/05/2021   Generalized anxiety disorder 04/05/2021   Major depressive disorder, recurrent episode, moderate (HCC) 04/04/2021    Depression 02/06/2018   Bone deformity 02/06/2018   Screening examination for venereal disease 02/16/2016   Encounter for long-term (current) use of medications 02/16/2016   Psoriatic arthritis (HCC) 05/19/2013   Smoker 11/15/2011   Human immunodeficiency virus (HIV) disease (HCC) 03/18/2008     Problem List Items Addressed This Visit       Musculoskeletal and Integument   Psoriasis - Primary    Mr. Polo has an exacerbation  of what appears to be psoriasis. Will start clobetasol cream and shampoo and start oral prednisone dosepak to help control his symptoms. Recommend emulsion creams in addition to steroid cream to help moisturize. Will place referral to dermatology for further evaluation and treatment given his previous need for Humira.       Relevant Orders   Ambulatory referral to Dermatology     Other   Human immunodeficiency virus (HIV) disease [facility])    Mr. Wingert has poorly controlled virus with viral load of 84,000 secondary to being off medication as a result of depression triggered by separation from his wife. Has restarted taking medication 2 days ago. Reviewed lab work and discussed plan of care. Continue current dose of Symtuza. Plan for follow up in 1 month or sooner if needed with [name] to ensure viral suppression.       Relevant Medications   Darunavir-Cobicistat-Emtricitabine-Tenofovir Alafenamide (SYMTUZA) 800-150-200-10 MG TABS   Bipolar affective disorder, currently depressed, moderate (HCC)    Mr. Golebiewski has exacerbation of his depression triggered by separating from his wife with severity that has led him to waxing and waning suicidal ideation with none currently. Have recommended counseling and provided Emergency Resources in his AVS and advised to seek care at the ED or through one of these sources if his symptoms worsen or thoughts of suicide with plan develop.       Relevant Medications   escitalopram (LEXAPRO) 20 MG tablet     I have changed Mellody Dance  Herbst's escitalopram and ARIPiprazole. I am also having him start on Clobetasol Propionate, Clobetasol Propionate, and predniSONE. Additionally, I am having him maintain his Adalimumab, ibuprofen, multivitamin, cyanocobalamin, Ascorbic Acid (VITAMIN C PO), testosterone cypionate, meloxicam, Tivicay, and Symtuza.   Meds ordered this encounter  Medications   Clobetasol Propionate 0.05 % shampoo    Sig: Apply thin film to dry scalp once daily. leave in place for 15 minutes, then add water, lather, and rinse thoroughly. Limit treatment to 4 consecutive weeks.    Dispense:  118 mL    Refill:  1    Order Specific Question:   Supervising Provider    Answer:   Judyann Munson [[id number]]   Clobetasol Propionate 0.025 % CREA    Sig: Apply 1 Application topically in the morning and at bedtime.    Dispense:  100 g    Refill:  2    Order Specific Question:   Supervising Provider    Answer:   Drue Second, CYNTHIA [[id number]]   predniSONE (STERAPRED UNI-PAK 21 TAB) 10 MG (21) TBPK tablet    Sig: Take per package instructions    Dispense:  21 each    Refill:  0    Order Specific Question:   Supervising Provider    Answer:   Drue Second, CYNTHIA [[id number]]   escitalopram (LEXAPRO) 20 MG tablet    Sig: Take 1 tablet (20 mg total) by mouth daily.    Dispense:  30 tablet    Refill:  2    Order Specific Question:   Supervising Provider    Answer:   Drue Second, CYNTHIA [[id number]]   ARIPiprazole (ABILIFY) 10 MG tablet    Sig: Take 1 tablet (10 mg total) by mouth daily.    Dispense:  30 tablet    Refill:  2    Order Specific Question:   Supervising Provider    Answer:   Drue Second, CYNTHIA [[id number]]   Darunavir-Cobicistat-Emtricitabine-Tenofovir Alafenamide (SYMTUZA) 800-150-200-10 MG TABS    Sig: TAKE 1 TABLET  BY MOUTH DAILY WITH BREAKFAST.    Dispense:  30 tablet    Refill:  2    Order Specific Question:   Supervising Provider    Answer:   Judyann Munson [[id number]]     Follow-up: Return in about 1 month (around 07/27/2022), or if  symptoms worsen or fail to improve.   Marcos Eke, MSN, FNP-C Nurse Practitioner [facility] Main number: [phone]

## 2022-06-26 NOTE — Patient Instructions (Signed)
Nice to see you.  We will work on improving things.   If you develop more thoughts of harming yourself or have plan go to the ED  or here are additional resources to seek help:  Emergency Resources Call or text 988 or visit 988lifeline.org  [facility] [employer] [phone] 24 hours a day, 7 days a week   [facility]        [phone]       [address] [place], [place] [address]      Open 24/7, No appointment required.  Plan for follow up with [name] in 1 month or sooner if needed.

## 2022-06-26 NOTE — Assessment & Plan Note (Signed)
Garrett Schroeder has an exacerbation of what appears to be psoriasis. Will start clobetasol cream and shampoo and start oral prednisone dosepak to help control his symptoms. Recommend emulsion creams in addition to steroid cream to help moisturize. Will place referral to dermatology for further evaluation and treatment given his previous need for Humira.

## 2022-07-03 ENCOUNTER — Ambulatory Visit: Payer: Medicaid Other | Admitting: Internal Medicine

## 2022-07-31 ENCOUNTER — Telehealth: Payer: Self-pay

## 2022-07-31 ENCOUNTER — Other Ambulatory Visit: Payer: Self-pay | Admitting: Internal Medicine

## 2022-07-31 ENCOUNTER — Encounter: Payer: Self-pay | Admitting: Internal Medicine

## 2022-07-31 ENCOUNTER — Ambulatory Visit (INDEPENDENT_AMBULATORY_CARE_PROVIDER_SITE_OTHER): Payer: Medicaid Other | Admitting: Internal Medicine

## 2022-07-31 ENCOUNTER — Other Ambulatory Visit: Payer: Self-pay

## 2022-07-31 VITALS — BP 109/73 | HR 86 | Temp 98.1°F | Ht 66.0 in | Wt 135.0 lb

## 2022-07-31 DIAGNOSIS — F331 Major depressive disorder, recurrent, moderate: Secondary | ICD-10-CM | POA: Diagnosis not present

## 2022-07-31 DIAGNOSIS — Z113 Encounter for screening for infections with a predominantly sexual mode of transmission: Secondary | ICD-10-CM

## 2022-07-31 DIAGNOSIS — L4050 Arthropathic psoriasis, unspecified: Secondary | ICD-10-CM

## 2022-07-31 DIAGNOSIS — B20 Human immunodeficiency virus [HIV] disease: Secondary | ICD-10-CM

## 2022-07-31 MED ORDER — TRIAMCINOLONE ACETONIDE 0.025 % EX OINT: 1.0000 | TOPICAL_OINTMENT | Freq: Two times a day (BID) | CUTANEOUS | 3 refills | Status: AC

## 2022-07-31 MED ORDER — SYMTUZA 800-150-200-10 MG PO TABS: 1.0000 | ORAL_TABLET | Freq: Every day | ORAL | 11 refills | Status: DC

## 2022-07-31 MED ORDER — ESCITALOPRAM OXALATE 20 MG PO TABS: 20.0000 mg | ORAL_TABLET | Freq: Every day | ORAL | 11 refills | Status: DC

## 2022-07-31 MED ORDER — FLUOCINONIDE EMULSIFIED BASE 0.05 % EX CREA: 1.0000 | TOPICAL_CREAM | Freq: Two times a day (BID) | CUTANEOUS | 11 refills | Status: DC

## 2022-07-31 MED ORDER — ARIPIPRAZOLE 10 MG PO TABS: 10.0000 mg | ORAL_TABLET | Freq: Every day | ORAL | 11 refills | Status: DC

## 2022-07-31 MED ORDER — MELOXICAM 15 MG PO TABS: 15.0000 mg | ORAL_TABLET | Freq: Every day | ORAL | 2 refills | Status: AC

## 2022-07-31 MED ORDER — TIVICAY 50 MG PO TABS: ORAL_TABLET | Freq: Every day | ORAL | 11 refills | Status: DC

## 2022-07-31 NOTE — Assessment & Plan Note (Signed)
He has a significant flare between his recent poor HIV control and off his medications.  I will give him meloxicam 15 mg once a day for now and he will folllow up with his appointment next week with dermatology

## 2022-07-31 NOTE — Assessment & Plan Note (Addendum)
I had a long discussion with him regarding his care and how to best get better.  He is back on his ARVs, though it appears just the [place] so I will add the Tivicay as well.  He has presumed resistance from care in [place] with no records but he did well with that.  He may do well with Symtuza alone but will add back the Tivicay for now.  Will check his labs today.  He will follow up in 6 weeks.   I have personally spent 40 minutes involved in face-to-face and non-face-to-face activities for this patient on the day of the visit. Professional time spent includes the following activities: Preparing to see the patient (review of tests), Obtaining and/or reviewing separately obtained history (admission/discharge record), Performing a medically appropriate examination and/or evaluation , Ordering medications/tests/procedures, referring and communicating with other health care professionals, Documenting clinical information in the EMR, Independently interpreting results (not separately reported), Communicating results to the patient/family/caregiver, Counseling and educating the patient/family/caregiver and Care coordination (not separately reported).

## 2022-07-31 NOTE — Assessment & Plan Note (Signed)
He is currently depressed with his current situation and he has continued with Abilify and Lexapro and I will refill those today for him.  He has been in mental health care but does not plan to go back for now.  I will continue to fill these medications for him.

## 2022-07-31 NOTE — Assessment & Plan Note (Signed)
Screened negative 

## 2022-07-31 NOTE — Progress Notes (Signed)
   Subjective:    Patient ID: Akiel Fennell, male    DOB: [birth year], 51 y.o.   MRN: [id number]  HPI Here for follow up of HIV He has been on [place] and [place] with presumed resistance from treatment in [place] previously and here for follow up care.  He unfortunately is now divorcing his wife and has been depressed about that.  He intially had stopped his medications for about 2-3 months but now back on.  He has lost weight and down to 135 lbs from 154 lbs previously.  His psoratic arthrtitis has signficantly flared and he has an appointment with dermatology next week.     Review of Systems  Constitutional:  Negative for fatigue.  Gastrointestinal:  Negative for diarrhea.  Skin:  Negative for rash.       Objective:   Physical Exam Eyes:     General: No scleral icterus. Pulmonary:     Effort: Pulmonary effort is normal.  Musculoskeletal:     Comments: Diffuse joint contractions, swelling  Skin:    Comments: Diffuse psoriatic lesions  Neurological:     General: No focal deficit present.     Mental Status: He is alert.   SH: + tobacco        Assessment & Plan:

## 2022-07-31 NOTE — Telephone Encounter (Signed)
Patient called stating that fluocinonide cream isn't covered by his insurance. Informed Dr.Comer that on medicaid drug formulary betamethasone or triamcinolone cream is the preferred creams. Alternative cream has been sent to patient pharmacy. Patient aware and voiced his understanding.   [place], CMA

## 2022-08-01 ENCOUNTER — Other Ambulatory Visit: Payer: Self-pay | Admitting: Internal Medicine

## 2022-08-01 LAB — T-HELPER CELL (CD4) - (RCID CLINIC ONLY)
CD4 % Helper T Cell: 22 % — ABNORMAL LOW (ref 33–65)
CD4 T Cell Abs: 265 /uL — ABNORMAL LOW (ref 400–1790)

## 2022-08-01 MED ORDER — FLUOCINONIDE 0.05 % EX CREA: 1.0000 | TOPICAL_CREAM | Freq: Two times a day (BID) | CUTANEOUS | 3 refills | Status: AC

## 2022-08-02 ENCOUNTER — Ambulatory Visit: Payer: Medicaid Other | Admitting: Internal Medicine

## 2022-08-02 ENCOUNTER — Telehealth: Payer: Self-pay

## 2022-08-02 LAB — COMPLETE METABOLIC PANEL WITH GFR
AG Ratio: 1.2 (calc) (ref 1.0–2.5)
ALT: 14 U/L (ref 9–46)
AST: 13 U/L (ref 10–35)
Albumin: 4.2 g/dL (ref 3.6–5.1)
Alkaline phosphatase (APISO): 70 U/L (ref 35–144)
BUN: 10 mg/dL (ref 7–25)
CO2: 27 mmol/L (ref 20–32)
Calcium: 9.7 mg/dL (ref 8.6–10.3)
Chloride: 98 mmol/L (ref 98–110)
Creat: 1.05 mg/dL (ref 0.70–1.30)
Globulin: 3.4 g/dL (calc) (ref 1.9–3.7)
Glucose, Bld: 121 mg/dL — ABNORMAL HIGH (ref 65–99)
Potassium: 4 mmol/L (ref 3.5–5.3)
Sodium: 138 mmol/L (ref 135–146)
Total Bilirubin: 0.4 mg/dL (ref 0.2–1.2)
Total Protein: 7.6 g/dL (ref 6.1–8.1)
eGFR: 86 mL/min/{1.73_m2} (ref >=60)

## 2022-08-02 LAB — HIV-1 RNA QUANT-NO REFLEX-BLD
HIV 1 RNA Quant: <20 Copies/mL — ABNORMAL HIGH
HIV-1 RNA Quant, Log: <1.3 Log cps/mL — ABNORMAL HIGH

## 2022-08-02 NOTE — Telephone Encounter (Signed)
Patient called requesting lab results. I advised patient some labs are still in process and we would contact him once the labs are reviewed by Dr.Comer.   [place], CMA

## 2022-08-03 NOTE — Telephone Encounter (Signed)
Patient aware and verbalized his understanding.    Garrett Schroeder P Garrett Schroeder, CMA  

## 2022-08-09 ENCOUNTER — Telehealth: Payer: Self-pay

## 2022-08-09 NOTE — Telephone Encounter (Signed)
Spoke with patient and relayed providers message. Leatrice Jewels, RMA

## 2022-08-09 NOTE — Telephone Encounter (Signed)
Patient called office today to inform provider he could be seen by Dermatology today due to not having $4 copay. Would also like to know if provider could send in prescription for Humira to his pharmacy.  Does not have a PCP who can send this prescription in. Advised patient establish care with PCP help manage medication that is not related to his status.  Leatrice Jewels, RMA

## 2022-08-13 NOTE — Telephone Encounter (Signed)
Patient called asking if there is anything that Dr.Comer could do for his psoriasis and that the fluocinonide cream wasn't helping. I informed patient dermatology would need to manage psoriasis. Patient stated that he was denied to be seen due to not having $4 copay. I advised him I could send a message back to see if Dr.Comer had any other recommendations.    [place], CMA

## 2022-08-14 NOTE — Telephone Encounter (Signed)
Patient aware. I advised patient to follow up with Dermatology so they can prescribe a medication to treat his psoriasis.   [place], CMA

## 2022-08-17 ENCOUNTER — Other Ambulatory Visit (HOSPITAL_COMMUNITY): Payer: Self-pay

## 2022-09-11 ENCOUNTER — Other Ambulatory Visit (HOSPITAL_COMMUNITY): Payer: Self-pay

## 2022-09-11 ENCOUNTER — Ambulatory Visit: Payer: Medicaid Other | Admitting: Internal Medicine

## 2022-09-25 ENCOUNTER — Ambulatory Visit: Payer: Medicaid Other | Admitting: Internal Medicine

## 2022-10-16 ENCOUNTER — Ambulatory Visit: Payer: Medicaid Other | Admitting: Pharmacist

## 2022-11-09 ENCOUNTER — Other Ambulatory Visit (HOSPITAL_COMMUNITY): Payer: Self-pay

## 2022-11-27 ENCOUNTER — Ambulatory Visit: Payer: Medicaid Other | Admitting: Internal Medicine

## 2022-11-28 ENCOUNTER — Ambulatory Visit: Payer: Medicaid Other | Admitting: Internal Medicine

## 2022-12-04 ENCOUNTER — Ambulatory Visit: Payer: Medicaid Other | Admitting: Internal Medicine

## 2023-02-05 ENCOUNTER — Telehealth: Payer: Self-pay

## 2023-02-05 NOTE — Telephone Encounter (Signed)
Detectable Viral Load Intervention   Most recent VL:  HIV 1 RNA Quant  Date Value Ref Range Status  07/31/2022 <20 (H) Copies/mL Final    Comment:    HIV-1 RNA Detected  06/19/2022 86,400 (H) Copies/mL Final  02/01/2022 Not Detected Copies/mL Final    Current ART regimen: Tivicay and Symtuza  Appointment status: patient has future appointment scheduled  Called patient to discuss medication adherence and possible barriers to care.   Medication last dispensed (per chart review): 07/31/22  Medication Adherence   What pharmacy do you use for your ART?  Do you pick up your medication at the pharmacy or is it mailed to you? pick up at pharmacy  How often do you miss a dose your ART? every day  Are you experiencing any side effects with your ART? no  Since your last visit, have you experienced any changes that have negatively impacted your health? Yes, depression.  Are you having any trouble remembering what medication(s) you are supposed to take or how you are supposed to take them? No  What helps you remember to take your medication(s)? When not suffering from depression   Barriers to Care   Are you experiencing any of the following?:  Lack of transportation to medical appointments? Yes  Housing instability? none  If you are currently employed, are you having difficulty taking time off of work for medical appointments? No  Financial concerns (rent, utilities, etc.) No  Lack of consistent access to food? No  Trouble remembering and attending your appointments? Yes at times  Are you experiencing any other barriers that make it hard for you to come to appointments or take medication regularly? Transportation is the biggest barrier and depression    Interventions:   Community message sent to [facility] for referral for depression and medication adherence. Referral and appointment made with counselor for assessment. Patient provided with phone number for medicaid  transportation to assist with transportation needs for all appointments. Referral place last year for [facility], staff call and had patient placed on waiting list for sooner appointment (next available in Aug,2024). Patient will follow up with pharmacy tomorrow for medication counseling/lab and will schedule follow up with Dr. Linus Salmons after he meets with pharmacy.  Eugenia Mcalpine, LPN

## 2023-02-05 NOTE — Telephone Encounter (Signed)
Thank you :)

## 2023-02-06 ENCOUNTER — Ambulatory Visit: Payer: Medicaid Other | Admitting: Pharmacist

## 2023-02-07 ENCOUNTER — Other Ambulatory Visit: Payer: Self-pay

## 2023-02-07 ENCOUNTER — Ambulatory Visit (INDEPENDENT_AMBULATORY_CARE_PROVIDER_SITE_OTHER): Payer: Medicaid Other | Admitting: Pharmacist

## 2023-02-07 ENCOUNTER — Other Ambulatory Visit (HOSPITAL_COMMUNITY): Payer: Self-pay

## 2023-02-07 VITALS — Wt 126.7 lb

## 2023-02-07 DIAGNOSIS — B20 Human immunodeficiency virus [HIV] disease: Secondary | ICD-10-CM

## 2023-02-07 DIAGNOSIS — Z113 Encounter for screening for infections with a predominantly sexual mode of transmission: Secondary | ICD-10-CM | POA: Diagnosis not present

## 2023-02-07 MED ORDER — SYMTUZA 800-150-200-10 MG PO TABS
1.0000 | ORAL_TABLET | Freq: Every day | ORAL | 1 refills | Status: DC
  Filled 2023-02-07 – 2023-02-12 (×2): qty 30, 30d supply, fill #0
  Filled 2023-03-05: qty 30, 30d supply, fill #1

## 2023-02-07 MED ORDER — TIVICAY 50 MG PO TABS
ORAL_TABLET | Freq: Every day | ORAL | 1 refills | Status: DC
  Filled 2023-02-07: qty 30, fill #0
  Filled 2023-02-12: qty 30, 30d supply, fill #0
  Filled 2023-03-05: qty 30, 30d supply, fill #1

## 2023-02-07 NOTE — Progress Notes (Signed)
02/07/2023  HPI: Garrett Schroeder is a 52 y.o. male who presents to the [facility] for HIV follow-up.  Patient Active Problem List   Diagnosis Date Noted   Psoriasis 06/26/2022   Medication monitoring encounter 02/01/2022   Erectile dysfunction 04/26/2021   Bipolar affective disorder, currently depressed, moderate 04/05/2021   Generalized anxiety disorder 04/05/2021   Major depressive disorder, recurrent episode, moderate 04/04/2021   Depression 02/06/2018   Bone deformity 02/06/2018   Screening examination for venereal disease 02/16/2016   Encounter for long-term (current) use of medications 02/16/2016   Psoriatic arthritis 05/19/2013   Smoker 11/15/2011   Human immunodeficiency virus (HIV) disease 03/18/2008    Patient's Medications  New Prescriptions   No medications on file  Previous Medications   ADALIMUMAB 40 MG/0.4ML PNKT    Inject 0.4 mLs into the skin every 14 (fourteen) days.   ARIPIPRAZOLE (ABILIFY) 10 MG TABLET    Take 1 tablet (10 mg total) by mouth daily.   ASCORBIC ACID (VITAMIN C PO)    Take by mouth.   CLOBETASOL PROPIONATE 0.05 % SHAMPOO    Apply thin film to dry scalp once daily. leave in place for 15 minutes, then add water, lather, and rinse thoroughly. Limit treatment to 4 consecutive weeks.   CYANOCOBALAMIN 1000 MCG TABLET    Take 1 tablet by mouth daily.   DARUNAVIR-COBICISTAT-EMTRICITABINE-TENOFOVIR ALAFENAMIDE (SYMTUZA) 800-150-200-10 MG TABS    TAKE 1 TABLET BY MOUTH DAILY WITH BREAKFAST.   DOLUTEGRAVIR (TIVICAY) 50 MG TABLET    TAKE 1 TABLET (50 MG TOTAL) BY MOUTH DAILY.   ESCITALOPRAM (LEXAPRO) 20 MG TABLET    Take 1 tablet (20 mg total) by mouth daily.   FLUOCINONIDE CREAM (LIDEX) 0.05 %    Apply 1 Application topically 2 (two) times daily.   IBUPROFEN (ADVIL) 200 MG TABLET    Take 2,000-2,400 mg by mouth daily as needed (pain).   MELOXICAM (MOBIC) 15 MG TABLET    Take 1 tablet (15 mg total) by mouth daily.   MULTIPLE VITAMIN (MULTIVITAMIN)  TABLET    Take 1 tablet by mouth daily.   PREDNISONE (STERAPRED UNI-PAK 21 TAB) 10 MG (21) TBPK TABLET    Take per package instructions   TESTOSTERONE CYPIONATE (DEPOTESTOSTERONE CYPIONATE) 200 MG/ML INJECTION       TRIAMCINOLONE (KENALOG) 0.025 % OINTMENT    Apply 1 Application topically 2 (two) times daily.  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[place])     Social History: Social History   Socioeconomic History   Marital status: Married    Spouse name: Not on file   Number of children: Not on file   Years of education: Not on file   Highest education level: Not on file  Occupational History   Not on file  Tobacco Use   Smoking status: Every Day    Packs/day: 1.00    Years: 36.00    Additional pack years: 0.00    Total pack years: 36.00    Types: Cigarettes   Smokeless tobacco: Never  Vaping Use   Vaping Use: Former  Substance and Sexual Activity   Alcohol use: Yes    Alcohol/week: 5.0 standard drinks of alcohol    Types: 5 Standard  drinks or equivalent per week   Drug use: Yes    Frequency: 20.0 times per week    Types: Marijuana   Sexual activity: Yes    Partners: Female    Comment: DECLINED CONDOMS  Other Topics Concern   Not on file  Social History Narrative   Not on file   Social Determinants of Health   Financial Resource Strain: Not on file  Food Insecurity: Not on file  Transportation Needs: Not on file  Physical Activity: Not on file  Stress: Not on file  Social Connections: Not on file    Labs: Lab Results  Component Value Date   HIV1RNAQUANT <20 (H) 07/31/2022   HIV1RNAQUANT 86,400 (H) 06/19/2022   HIV1RNAQUANT Not Detected 02/01/2022   CD4TABS 265 (L) 07/31/2022   CD4TABS 332 (L) 06/19/2022    CD4TABS 315 (L) 02/01/2022    RPR and STI Lab Results  Component Value Date   LABRPR NON-REACTIVE 06/19/2022   LABRPR NON-REACTIVE 05/26/2021   LABRPR NON-REACTIVE 04/12/2020   LABRPR NON-REACTIVE 02/02/2019   LABRPR NON-REACTIVE 08/20/2018        No data to display          Hepatitis B Lab Results  Component Value Date   HEPBSAB NO 12/30/2006   HEPBSAG NEGATIVE 02/11/2008   Hepatitis C No results found for: "HEPCAB", "HCVRNAPCRQN" Hepatitis A No results found for: "HAV" Lipids: Lab Results  Component Value Date   CHOL 152 06/19/2022   TRIG 143 06/19/2022   HDL 34 (L) 06/19/2022   CHOLHDL 4.5 06/19/2022   VLDL 18 08/16/2016   LDLCALC 94 06/19/2022    Current HIV Regimen: Symtuza and Tivicay   Assessment: Garrett Schroeder presents today for HIV follow up. He states he has not taken his medicine since July and states he is not well. His recent divorce and depression have contributed to his noncompliance. He considered committing suicide last summer but is hopeful to return to a happier version of his former self. He has a picture of himself during happier times that motivates him.   Patient complains of pain today. His psoriasis flares have gotten worse since he stopped seeing his former dermatologist in [place]. Garrett Schroeder has helped him in the past. Will pass along his concerns to Dr. Linus Salmons to address at next visit.   His HIV medications previously arrived via the mail but he has not received them recently. Encouraged patient to reestablish care with the [facility] mail service. 1 month of Symtuza and Tivicay was sent to the pharmacy today.   Will check an HIV RNA, CD4, and RPR today. He states that he has not been sexually active since his last visit and politely declines chlamydia and gonorrhea cytology swabs.   Patient is due for a number of vaccines. Pharmacy will follow along to encourage completion of these when patient is feeling up to it.    Plan: -Sent prescriptions to [place]  -Provided patient with [facility] resources for continued support -Follow up HIV RNA, CD4 count, and RPR  -Follow up to complete vaccines as patient is willing  -Next appointment scheduled for 03/19/2023 with Dr. Linus Salmons   Vicenta Dunning, PharmD  PGY1 Pharmacy Resident

## 2023-02-08 LAB — T-HELPER CELLS (CD4) COUNT (NOT AT ARMC)
CD4 % Helper T Cell: 20 % — ABNORMAL LOW (ref 33–65)
CD4 T Cell Abs: 278 /uL — ABNORMAL LOW (ref 400–1790)

## 2023-02-09 LAB — RPR: RPR Ser Ql: NONREACTIVE

## 2023-02-09 LAB — HIV-1 RNA QUANT-NO REFLEX-BLD
HIV 1 RNA Quant: 16100 Copies/mL — ABNORMAL HIGH
HIV-1 RNA Quant, Log: 4.21 Log cps/mL — ABNORMAL HIGH

## 2023-02-11 ENCOUNTER — Telehealth: Payer: Self-pay

## 2023-02-11 NOTE — Telephone Encounter (Signed)
Patient called stating that he received notification for test results but he is unable to see it on his phone. Advised patient that the pharmacist will have to review result and then they will reach out to him directly. Patient voiced his understanding. Patient call back number - [phone].    Srinidhi Landers Lesli Albee, CMA

## 2023-02-12 ENCOUNTER — Other Ambulatory Visit: Payer: Self-pay

## 2023-02-12 ENCOUNTER — Ambulatory Visit: Payer: Medicaid Other

## 2023-02-12 ENCOUNTER — Other Ambulatory Visit (HOSPITAL_COMMUNITY): Payer: Self-pay

## 2023-02-12 NOTE — Telephone Encounter (Signed)
Patient called back to discuss results. HIV RNA is of course elevated but not as high as I anticipated. His CD4 count is also not decreased as low as I would have expected. Encouraged him to call [facility] so that he can have his medication filled. Marchelle Folks

## 2023-02-12 NOTE — Telephone Encounter (Signed)
LVM requesting callback today. Thanks!

## 2023-03-05 ENCOUNTER — Other Ambulatory Visit (HOSPITAL_COMMUNITY): Payer: Self-pay

## 2023-03-08 ENCOUNTER — Other Ambulatory Visit (HOSPITAL_COMMUNITY): Payer: Self-pay

## 2023-03-19 ENCOUNTER — Other Ambulatory Visit: Payer: Self-pay

## 2023-03-19 ENCOUNTER — Ambulatory Visit (INDEPENDENT_AMBULATORY_CARE_PROVIDER_SITE_OTHER): Payer: Medicaid Other | Admitting: Internal Medicine

## 2023-03-19 ENCOUNTER — Encounter: Payer: Self-pay | Admitting: Internal Medicine

## 2023-03-19 DIAGNOSIS — F3132 Bipolar disorder, current episode depressed, moderate: Secondary | ICD-10-CM | POA: Diagnosis not present

## 2023-03-19 DIAGNOSIS — B20 Human immunodeficiency virus [HIV] disease: Secondary | ICD-10-CM | POA: Diagnosis not present

## 2023-03-19 DIAGNOSIS — F331 Major depressive disorder, recurrent, moderate: Secondary | ICD-10-CM

## 2023-03-19 DIAGNOSIS — L4050 Arthropathic psoriasis, unspecified: Secondary | ICD-10-CM

## 2023-03-19 DIAGNOSIS — Z79899 Other long term (current) drug therapy: Secondary | ICD-10-CM | POA: Diagnosis not present

## 2023-03-19 DIAGNOSIS — Z113 Encounter for screening for infections with a predominantly sexual mode of transmission: Secondary | ICD-10-CM

## 2023-03-19 MED ORDER — SYMTUZA 800-150-200-10 MG PO TABS: 1.0000 | ORAL_TABLET | Freq: Every day | ORAL | 11 refills | Status: DC

## 2023-03-19 MED ORDER — ARIPIPRAZOLE 10 MG PO TABS: 10.0000 mg | ORAL_TABLET | Freq: Every day | ORAL | 1 refills | Status: AC

## 2023-03-19 MED ORDER — ESCITALOPRAM OXALATE 20 MG PO TABS: 20.0000 mg | ORAL_TABLET | Freq: Every day | ORAL | 1 refills | Status: AC

## 2023-03-19 MED ORDER — TIVICAY 50 MG PO TABS: ORAL_TABLET | Freq: Every day | ORAL | 11 refills | Status: DC

## 2023-03-19 MED ORDER — IBUPROFEN 800 MG PO TABS: 800.0000 mg | ORAL_TABLET | Freq: Three times a day (TID) | ORAL | 1 refills | Status: AC | PRN

## 2023-03-19 NOTE — Assessment & Plan Note (Signed)
I will send in ibuprofen

## 2023-03-19 NOTE — Assessment & Plan Note (Signed)
Overall mood improving and trying to make life changes.   I have refilled his abilify and lexapro until he can get established again with mental health.

## 2023-03-19 NOTE — Assessment & Plan Note (Signed)
He is back on medication and seems to be doing well so will have him get labs in about 2 weeks.  He will then call for follow up if he does not remain in [place].   Refills provided.

## 2023-03-19 NOTE — Progress Notes (Signed)
   Subjective:    Patient ID: Garrett Schroeder, male    DOB: 02-12-1971, 52 y.o.   MRN: [id number]  I connected with  Garrett Schroeder on 03/19/23 by telephone and verified that I am speaking with the correct person using two identifiers.   I discussed the limitations of evaluation and management by telemedicine. The patient expressed understanding and agreed to proceed.  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  He is trying to get back on track after his depressive period from the breakup of his marriage.  He has not been back in care with anyone else.  He plans to move to [facility] and see if he can get established there or not.  Having issues with gout.    Review of Systems  Constitutional:  Negative for fatigue.  Gastrointestinal:  Negative for diarrhea and nausea.  Skin:  Negative for rash.       Objective:   Physical Exam Pulmonary:     Effort: Pulmonary effort is normal.  Neurological:     Mental Status: He is alert.           Assessment & Plan:

## 2023-04-02 ENCOUNTER — Other Ambulatory Visit (HOSPITAL_COMMUNITY): Payer: Self-pay

## 2023-04-03 ENCOUNTER — Other Ambulatory Visit: Payer: Medicaid Other

## 2023-04-03 ENCOUNTER — Other Ambulatory Visit: Payer: Self-pay

## 2023-04-03 ENCOUNTER — Other Ambulatory Visit (HOSPITAL_COMMUNITY): Payer: Self-pay

## 2023-04-03 DIAGNOSIS — B20 Human immunodeficiency virus [HIV] disease: Secondary | ICD-10-CM

## 2023-04-04 ENCOUNTER — Other Ambulatory Visit (HOSPITAL_COMMUNITY): Payer: Self-pay

## 2023-04-04 ENCOUNTER — Other Ambulatory Visit: Payer: Self-pay | Admitting: Pharmacist

## 2023-04-04 DIAGNOSIS — B20 Human immunodeficiency virus [HIV] disease: Secondary | ICD-10-CM

## 2023-04-05 ENCOUNTER — Other Ambulatory Visit: Payer: Self-pay | Admitting: Pharmacist

## 2023-04-05 ENCOUNTER — Other Ambulatory Visit (HOSPITAL_COMMUNITY): Payer: Self-pay

## 2023-04-05 ENCOUNTER — Other Ambulatory Visit: Payer: Self-pay

## 2023-04-05 DIAGNOSIS — B20 Human immunodeficiency virus [HIV] disease: Secondary | ICD-10-CM

## 2023-04-05 MED ORDER — SYMTUZA 800-150-200-10 MG PO TABS
1.0000 | ORAL_TABLET | Freq: Every day | ORAL | 11 refills | Status: DC
  Filled 2023-04-05 – 2023-04-16 (×2): qty 30, 30d supply, fill #0
  Filled 2023-05-13: qty 30, 30d supply, fill #1

## 2023-04-05 MED ORDER — TIVICAY 50 MG PO TABS
ORAL_TABLET | Freq: Every day | ORAL | 11 refills | Status: DC
  Filled 2023-04-05 – 2023-04-16 (×2): qty 30, 30d supply, fill #0
  Filled 2023-05-13: qty 30, 30d supply, fill #1

## 2023-04-05 NOTE — Progress Notes (Signed)
Patient requests filling through [facility]. Resent scripts there.  Margarite Gouge, PharmD, CPP, BCIDP, AAHIVP Clinical Pharmacist Practitioner Infectious Diseases Clinical Pharmacist [facility]

## 2023-04-11 ENCOUNTER — Other Ambulatory Visit (HOSPITAL_COMMUNITY): Payer: Self-pay

## 2023-04-16 ENCOUNTER — Other Ambulatory Visit (HOSPITAL_COMMUNITY): Payer: Self-pay

## 2023-04-16 ENCOUNTER — Other Ambulatory Visit: Payer: Self-pay

## 2023-04-24 ENCOUNTER — Other Ambulatory Visit (HOSPITAL_COMMUNITY): Payer: Self-pay

## 2023-05-13 ENCOUNTER — Other Ambulatory Visit: Payer: Self-pay

## 2023-05-17 ENCOUNTER — Other Ambulatory Visit (HOSPITAL_COMMUNITY): Payer: Self-pay

## 2023-06-07 ENCOUNTER — Other Ambulatory Visit (HOSPITAL_COMMUNITY): Payer: Self-pay

## 2023-06-17 ENCOUNTER — Other Ambulatory Visit (HOSPITAL_COMMUNITY): Payer: Self-pay

## 2023-06-19 ENCOUNTER — Encounter (HOSPITAL_COMMUNITY): Payer: Self-pay

## 2023-06-19 ENCOUNTER — Other Ambulatory Visit (HOSPITAL_COMMUNITY): Payer: Self-pay

## 2023-06-27 ENCOUNTER — Other Ambulatory Visit (HOSPITAL_COMMUNITY): Payer: Self-pay

## 2023-07-12 ENCOUNTER — Telehealth: Payer: Self-pay

## 2023-07-12 NOTE — Telephone Encounter (Signed)
Garrett Schroeder called, he would like [name] to know that he was in [place] for a short time and was hospitalized for 5 days for a suicide attempt. He is now moving back to [facility]. He has been off his ART since May and is not interested in restarting. He denies any suicidal thoughts presently. Advised that if he develops suicidal thoughts to go to the emergency department. Patient verbalized understanding and has no further questions.   Sandie Ano, RN

## 2023-08-07 ENCOUNTER — Other Ambulatory Visit: Payer: Self-pay

## 2023-08-20 ENCOUNTER — Other Ambulatory Visit: Payer: Self-pay | Admitting: Pharmacist

## 2023-08-20 NOTE — Progress Notes (Signed)
Clinic and call center have been unable to reach. Last filled medication in July. Last note from [name] in May stated he may be moving to [place], [place].

## 2023-11-18 ENCOUNTER — Other Ambulatory Visit: Payer: Self-pay

## 2023-11-18 DIAGNOSIS — Z113 Encounter for screening for infections with a predominantly sexual mode of transmission: Secondary | ICD-10-CM

## 2023-11-18 DIAGNOSIS — B20 Human immunodeficiency virus [HIV] disease: Secondary | ICD-10-CM

## 2023-11-18 DIAGNOSIS — Z79899 Other long term (current) drug therapy: Secondary | ICD-10-CM

## 2023-11-21 ENCOUNTER — Other Ambulatory Visit: Payer: Medicaid Other

## 2023-12-05 ENCOUNTER — Encounter: Payer: Self-pay | Admitting: Internal Medicine

## 2023-12-10 ENCOUNTER — Other Ambulatory Visit: Payer: Medicaid Other

## 2023-12-24 ENCOUNTER — Encounter: Payer: Self-pay | Admitting: Internal Medicine

## 2024-03-27 NOTE — Progress Notes (Signed)
 The 10-year ASCVD risk score (Arnett DK, et al., 2019) is: 6.4%   Values used to calculate the score:     Age: 53 years     Sex: Male     Is Non-Hispanic African American: No     Diabetic: No     Tobacco smoker: Yes     Systolic Blood Pressure: 110 mmHg     Is BP treated: No     HDL Cholesterol: 34 mg/dL     Total Cholesterol: 152 mg/dL  No current statin therapy, no upcoming appointment.   Nollan Muldrow, BSN, RN

## 2024-06-15 ENCOUNTER — Telehealth: Payer: Self-pay

## 2024-06-15 NOTE — Telephone Encounter (Signed)
 Patient called office to ask what his ART regimen was. Has recently moved to [place], [place] and is establishing care with [facility]. Is setup for new pt appt this Wednesday 8/13 at 8:20.  Lorenda CHRISTELLA Code, RMA

## 2024-12-03 ENCOUNTER — Ambulatory Visit: Payer: Self-pay | Admitting: Internal Medicine

## 2024-12-03 ENCOUNTER — Ambulatory Visit: Payer: Self-pay

## 2024-12-10 ENCOUNTER — Encounter: Payer: Self-pay | Admitting: Internal Medicine

## 2024-12-10 ENCOUNTER — Telehealth: Payer: Self-pay

## 2024-12-10 ENCOUNTER — Other Ambulatory Visit: Payer: Self-pay

## 2024-12-10 ENCOUNTER — Ambulatory Visit: Payer: Self-pay

## 2024-12-10 ENCOUNTER — Other Ambulatory Visit (HOSPITAL_COMMUNITY): Payer: Self-pay

## 2024-12-10 ENCOUNTER — Ambulatory Visit: Payer: Self-pay | Admitting: Internal Medicine

## 2024-12-10 DIAGNOSIS — B20 Human immunodeficiency virus [HIV] disease: Secondary | ICD-10-CM

## 2024-12-10 MED ORDER — VALACYCLOVIR HCL 1 G PO TABS
1000.0000 mg | ORAL_TABLET | Freq: Two times a day (BID) | ORAL | 0 refills | Status: DC
  Filled 2024-12-10: qty 14, 7d supply, fill #0

## 2024-12-10 MED ORDER — VALACYCLOVIR HCL 1 G PO TABS: 1000.0000 mg | ORAL_TABLET | Freq: Two times a day (BID) | ORAL | 0 refills | Status: AC

## 2024-12-10 MED ORDER — TIVICAY 50 MG PO TABS: ORAL_TABLET | Freq: Every day | ORAL | 11 refills | Status: AC

## 2024-12-10 MED ORDER — TIVICAY 50 MG PO TABS: ORAL_TABLET | Freq: Every day | ORAL | 11 refills | Status: DC

## 2024-12-10 MED ORDER — SYMTUZA 800-150-200-10 MG PO TABS: 1.0000 | ORAL_TABLET | Freq: Every day | ORAL | 11 refills | Status: DC

## 2024-12-10 MED ORDER — SYMTUZA 800-150-200-10 MG PO TABS: 1.0000 | ORAL_TABLET | Freq: Every day | ORAL | 11 refills | Status: AC

## 2024-12-10 NOTE — Telephone Encounter (Signed)
 Per Cathryne and Tasha patient is refusing to apply for Medicaid. Deanna took patient upstairs to apply for [place] Medicaid, patient currently has [place]  Medicaid. Patient was rude to the staff upstairs with Medicaid and walked out. We are unable to provide the patient with samples (Symtuza  and Tivicay ). I reached out to the patient to help resolve the situation and patient stated it will take to long to get approved for Medicaid. I explained to the patient we are able to get him RW, but he still has to apply for Medicaid. Patient stated  I just wont take the medication and hung up the phone. .me

## 2024-12-10 NOTE — Progress Notes (Unsigned)
 "      [facility]     HPI: Garrett Schroeder is a 54 y.o. male presents for HIV management. No t sexaully acitve since lv Only sexually acitve with women. Returning form [place], change in scenori.   Past Medical History:  Diagnosis Date   HIV infection ([facility])    Psoriatic arthritis mutilans ([facility])     No past surgical history on file.  Family History  Problem Relation Age of Onset   Arthritis Neg Hx    Medications Ordered Prior to Encounter[1]  Allergies[2]    Lab Results HIV 1 RNA Quant (Copies/mL)  Date Value  02/07/2023 16,100 (H)  07/31/2022 <20 (H)  06/19/2022 86,400 (H)   CD4 T Cell Abs (/uL)  Date Value  02/07/2023 278 (L)  07/31/2022 265 (L)  06/19/2022 332 (L)   No results found for: HIV1GENOSEQ Lab Results  Component Value Date   WBC 5.1 06/19/2022   HGB 16.3 06/19/2022   HCT 46.5 06/19/2022   MCV 92.6 06/19/2022   PLT 261 06/19/2022    Lab Results  Component Value Date   CREATININE 1.05 07/31/2022   BUN 10 07/31/2022   NA 138 07/31/2022   K 4.0 07/31/2022   CL 98 07/31/2022   CO2 27 07/31/2022   Lab Results  Component Value Date   ALT 14 07/31/2022   AST 13 07/31/2022   ALKPHOS 61 08/16/2016   BILITOT 0.4 07/31/2022    Lab Results  Component Value Date   CHOL 152 06/19/2022   TRIG 143 06/19/2022   HDL 34 (L) 06/19/2022   LDLCALC 94 06/19/2022   No results found for: HAV Lab Results  Component Value Date   HEPBSAG NEGATIVE 02/11/2008   HEPBSAB NO 12/30/2006   Lab Results  Component Value Date   HCVAB NEGATIVE 02/11/2008   No results found for: CHLAMYDIAWP, N No results found for: GCPROBEAPT No results found for: QUANTGOLD  Assessment/Plan #HIV -symtuza  + tivicay  Has been off of art for a couple weeks. Since visit in May, 2024 [place] and has only taken one month of art - #Vaccination COVID Flu needs Monkeypox PCV needs Meningitis needs HepA serology HEpB  serology Tdap Shingles  #Health maintenance-today -Quantiferon -RPR -HCV -GC -Lipid -Colonoscopy NV    Loney Stank, MD [facility]     [1]  Current Outpatient Medications on File Prior to Visit  Medication Sig Dispense Refill   ARIPiprazole  (ABILIFY ) 10 MG tablet Take 1 tablet (10 mg total) by mouth daily. 30 tablet 1   Darunavir -Cobicistat-Emtricitabine -Tenofovir  Alafenamide (SYMTUZA ) 800-150-200-10 MG TABS TAKE 1 TABLET BY MOUTH DAILY WITH BREAKFAST. 30 tablet 11   dolutegravir  (TIVICAY ) 50 MG tablet TAKE 1 TABLET (50 MG TOTAL) BY MOUTH DAILY. 30 tablet 11   escitalopram  (LEXAPRO ) 20 MG tablet Take 1 tablet (20 mg total) by mouth daily. 30 tablet 1   Adalimumab 40 MG/0.4ML PNKT Inject 0.4 mLs into the skin every 14 (fourteen) days.     Ascorbic Acid (VITAMIN C PO) Take by mouth. (Patient not taking: Reported on 03/19/2023)     Clobetasol  Propionate 0.05 % shampoo Apply thin film to dry scalp once daily. leave in place for 15 minutes, then add water, lather, and rinse thoroughly. Limit treatment to 4 consecutive weeks. (Patient not taking: Reported on 03/19/2023) 118 mL 1   cyanocobalamin 1000 MCG tablet Take 1 tablet by mouth daily. (Patient not taking: Reported on 03/19/2023)  fluocinonide  cream (LIDEX ) 0.05 % Apply 1 Application topically 2 (two) times daily. (Patient not taking: Reported on 03/19/2023) 240 g 3   ibuprofen  (ADVIL ) 200 MG tablet Take 2,000-2,400 mg by mouth daily as needed (pain). (Patient not taking: Reported on 03/19/2023)     ibuprofen  (ADVIL ) 800 MG tablet Take 1 tablet (800 mg total) by mouth every 8 (eight) hours as needed. 30 tablet 1   meloxicam  (MOBIC ) 15 MG tablet Take 1 tablet (15 mg total) by mouth daily. (Patient not taking: Reported on 03/19/2023) 30 tablet 2   Multiple Vitamin (MULTIVITAMIN) tablet Take 1 tablet by mouth daily. (Patient not taking: Reported on 03/19/2023)     predniSONE   (STERAPRED UNI-PAK 21 TAB) 10 MG (21) TBPK tablet Take per package instructions (Patient not taking: Reported on 03/19/2023) 21 each 0   testosterone cypionate (DEPOTESTOSTERONE CYPIONATE) 200 MG/ML injection  (Patient not taking: Reported on 03/19/2023)     triamcinolone  (KENALOG ) 0.025 % ointment Apply 1 Application topically 2 (two) times daily. (Patient not taking: Reported on 03/19/2023) 454 g 3   No current facility-administered medications on file prior to visit.  [2]  Allergies Allergen Reactions   Codeine Itching   "

## 2024-12-11 LAB — T-HELPER CELLS (CD4) COUNT (NOT AT ARMC)
CD4 % Helper T Cell: 29 % — ABNORMAL LOW (ref 33–65)
CD4 T Cell Abs: 259 /uL — ABNORMAL LOW (ref 400–1790)

## 2024-12-11 LAB — CBC WITH DIFFERENTIAL/PLATELET
Absolute Lymphocytes: 979 {cells}/uL (ref 850–3900)
Absolute Monocytes: 531 {cells}/uL (ref 200–950)
Basophils Absolute: 18 {cells}/uL (ref 0–200)
Basophils Relative: 0.3 %
Eosinophils Absolute: 18 {cells}/uL (ref 15–500)
Eosinophils Relative: 0.3 %
HCT: 44.7 % (ref 39.4–51.1)
Hemoglobin: 15.6 g/dL (ref 13.2–17.1)
MCH: 31.5 pg (ref 27.0–33.0)
MCHC: 34.9 g/dL (ref 31.6–35.4)
MCV: 90.1 fL (ref 81.4–101.7)
MPV: 9.5 fL (ref 7.5–12.5)
Monocytes Relative: 9 %
Neutro Abs: 4354 {cells}/uL (ref 1500–7800)
Neutrophils Relative %: 73.8 %
Platelets: 319 10*3/uL (ref 140–400)
RBC: 4.96 Million/uL (ref 4.20–5.80)
RDW: 13.2 % (ref 11.0–15.0)
Total Lymphocyte: 16.6 %
WBC: 5.9 10*3/uL (ref 3.8–10.8)

## 2024-12-11 LAB — COMPLETE METABOLIC PANEL WITHOUT GFR
AG Ratio: 1.6 (calc) (ref 1.0–2.5)
ALT: 15 U/L (ref 9–46)
AST: 24 U/L (ref 10–35)
Albumin: 4.7 g/dL (ref 3.6–5.1)
Alkaline phosphatase (APISO): 69 U/L (ref 35–144)
BUN: 14 mg/dL (ref 7–25)
CO2: 28 mmol/L (ref 20–32)
Calcium: 9.7 mg/dL (ref 8.6–10.3)
Chloride: 101 mmol/L (ref 98–110)
Creat: 0.88 mg/dL (ref 0.70–1.30)
Globulin: 2.9 g/dL (ref 1.9–3.7)
Glucose, Bld: 105 mg/dL — ABNORMAL HIGH (ref 65–99)
Potassium: 3.9 mmol/L (ref 3.5–5.3)
Sodium: 138 mmol/L (ref 135–146)
Total Bilirubin: 0.6 mg/dL (ref 0.2–1.2)
Total Protein: 7.6 g/dL (ref 6.1–8.1)

## 2024-12-11 LAB — HEPATITIS B SURFACE ANTIGEN: Hepatitis B Surface Ag: NONREACTIVE

## 2024-12-11 LAB — URINE CYTOLOGY ANCILLARY ONLY
Chlamydia: NEGATIVE
Comment: NEGATIVE
Comment: NORMAL
Neisseria Gonorrhea: NEGATIVE

## 2024-12-11 LAB — HEPATITIS C ANTIBODY: Hepatitis C Ab: NONREACTIVE

## 2024-12-11 LAB — HEPATITIS B CORE ANTIBODY, TOTAL: Hep B Core Total Ab: NONREACTIVE

## 2024-12-11 LAB — LIPID PANEL
Cholesterol: 193 mg/dL
HDL: 72 mg/dL
LDL Cholesterol (Calc): 107 mg/dL — ABNORMAL HIGH
Non-HDL Cholesterol (Calc): 121 mg/dL
Total CHOL/HDL Ratio: 2.7 (calc)
Triglycerides: 54 mg/dL

## 2024-12-11 LAB — CYTOLOGY, (ORAL, ANAL, URETHRAL) ANCILLARY ONLY
Chlamydia: NEGATIVE
Comment: NEGATIVE
Comment: NORMAL
Neisseria Gonorrhea: NEGATIVE

## 2024-12-11 LAB — HEPATITIS B SURFACE ANTIBODY,QUALITATIVE: Hep B S Ab: NONREACTIVE

## 2024-12-11 LAB — SYPHILIS: RPR W/REFLEX TO RPR TITER AND TREPONEMAL ANTIBODIES, TRADITIONAL SCREENING AND DIAGNOSIS ALGORITHM: RPR Ser Ql: NONREACTIVE

## 2024-12-11 LAB — HEPATITIS A ANTIBODY, TOTAL: Hepatitis A AB,Total: REACTIVE — AB
